# Patient Record
Sex: Female | Born: 1995 | Race: Black or African American | Hispanic: No | Marital: Single | Smoking: Never smoker
Health system: Southern US, Community
[De-identification: ages and names within clinical notes are randomized; demographics above are authoritative.]

## PROBLEM LIST (undated history)

## (undated) DIAGNOSIS — Z789 Other specified health status: Secondary | ICD-10-CM

## (undated) HISTORY — PX: APPENDECTOMY: SHX54

## (undated) HISTORY — PX: THERAPEUTIC ABORTION: SHX798

---

## 2017-12-28 ENCOUNTER — Encounter (HOSPITAL_COMMUNITY): Payer: Self-pay

## 2017-12-28 ENCOUNTER — Ambulatory Visit (HOSPITAL_COMMUNITY)
Admission: EM | Admit: 2017-12-28 | Discharge: 2017-12-28 | Disposition: A | Discharge status: Home / Self Care | Payer: Self-pay | Attending: Internal Medicine | Admitting: Internal Medicine

## 2017-12-28 ENCOUNTER — Encounter (HOSPITAL_COMMUNITY): Payer: Self-pay | Admitting: *Deleted

## 2017-12-28 ENCOUNTER — Emergency Department (HOSPITAL_COMMUNITY): Payer: Self-pay

## 2017-12-28 ENCOUNTER — Emergency Department (HOSPITAL_COMMUNITY)
Admission: EM | Admit: 2017-12-28 | Discharge: 2017-12-28 | Disposition: A | Discharge status: Home / Self Care | Payer: Self-pay | Attending: Emergency Medicine | Admitting: Emergency Medicine

## 2017-12-28 ENCOUNTER — Other Ambulatory Visit: Payer: Self-pay

## 2017-12-28 DIAGNOSIS — J0110 Acute frontal sinusitis, unspecified: Secondary | ICD-10-CM

## 2017-12-28 DIAGNOSIS — J0130 Acute sphenoidal sinusitis, unspecified: Secondary | ICD-10-CM | POA: Insufficient documentation

## 2017-12-28 DIAGNOSIS — J0131 Acute recurrent sphenoidal sinusitis: Secondary | ICD-10-CM

## 2017-12-28 LAB — CBC WITH DIFFERENTIAL/PLATELET
ABS IMMATURE GRANULOCYTES: 0 10*3/uL (ref 0.0–0.1)
BASOS PCT: 0 %
Basophils Absolute: 0 10*3/uL (ref 0.0–0.1)
Eosinophils Absolute: 0.1 10*3/uL (ref 0.0–0.7)
Eosinophils Relative: 1 %
HCT: 39.8 % (ref 36.0–46.0)
Hemoglobin: 13.2 g/dL (ref 12.0–15.0)
Immature Granulocytes: 1 %
Lymphocytes Relative: 17 %
Lymphs Abs: 1.3 10*3/uL (ref 0.7–4.0)
MCH: 32.5 pg (ref 26.0–34.0)
MCHC: 33.2 g/dL (ref 30.0–36.0)
MCV: 98 fL (ref 78.0–100.0)
MONO ABS: 0.4 10*3/uL (ref 0.1–1.0)
MONOS PCT: 5 %
NEUTROS ABS: 5.8 10*3/uL (ref 1.7–7.7)
Neutrophils Relative %: 76 %
PLATELETS: 233 10*3/uL (ref 150–400)
RBC: 4.06 MIL/uL (ref 3.87–5.11)
RDW: 11.6 % (ref 11.5–15.5)
WBC: 7.7 10*3/uL (ref 4.0–10.5)

## 2017-12-28 LAB — BASIC METABOLIC PANEL
Anion gap: 9 (ref 5–15)
BUN: 5 mg/dL — ABNORMAL LOW (ref 6–20)
CALCIUM: 10.1 mg/dL (ref 8.9–10.3)
CO2: 24 mmol/L (ref 22–32)
Chloride: 104 mmol/L (ref 98–111)
Creatinine, Ser: 0.72 mg/dL (ref 0.44–1.00)
GLUCOSE: 94 mg/dL (ref 70–99)
POTASSIUM: 3.9 mmol/L (ref 3.5–5.1)
Sodium: 137 mmol/L (ref 135–145)

## 2017-12-28 LAB — I-STAT BETA HCG BLOOD, ED (MC, WL, AP ONLY): I-stat hCG, quantitative: <5 m[IU]/mL (ref <5)

## 2017-12-28 MED ORDER — IBUPROFEN 800 MG PO TABS: 800.0000 mg | ORAL_TABLET | Freq: Three times a day (TID) | ORAL | 0 refills | Status: DC

## 2017-12-28 MED ORDER — KETOROLAC TROMETHAMINE 30 MG/ML IJ SOLN
15.0000 mg | Freq: Once | INTRAMUSCULAR | Status: AC
  Administered 2017-12-28: 15 mg via INTRAVENOUS
  Filled 2017-12-28: qty 1

## 2017-12-28 MED ORDER — METOCLOPRAMIDE HCL 5 MG/ML IJ SOLN
10.0000 mg | Freq: Once | INTRAMUSCULAR | Status: AC
  Administered 2017-12-28: 10 mg via INTRAVENOUS
  Filled 2017-12-28: qty 2

## 2017-12-28 MED ORDER — ACETAMINOPHEN 325 MG PO TABS
ORAL_TABLET | ORAL | Status: AC
  Filled 2017-12-28: qty 3

## 2017-12-28 MED ORDER — DOXYCYCLINE HYCLATE 100 MG PO CAPS: 100.0000 mg | ORAL_CAPSULE | Freq: Two times a day (BID) | ORAL | 0 refills | Status: AC

## 2017-12-28 MED ORDER — ACETAMINOPHEN 325 MG PO TABS
975.0000 mg | ORAL_TABLET | Freq: Once | ORAL | Status: AC
  Administered 2017-12-28: 975 mg via ORAL

## 2017-12-28 MED ORDER — SODIUM CHLORIDE 0.9 % IV BOLUS
1000.0000 mL | Freq: Once | INTRAVENOUS | Status: AC
  Administered 2017-12-28: 1000 mL via INTRAVENOUS

## 2017-12-28 MED ORDER — FLUTICASONE PROPIONATE 50 MCG/ACT NA SUSP: 1.0000 | Freq: Every day | NASAL | 2 refills | Status: DC

## 2017-12-28 MED ORDER — IOHEXOL 300 MG/ML  SOLN
75.0000 mL | Freq: Once | INTRAMUSCULAR | Status: AC | PRN
  Administered 2017-12-28: 75 mL via INTRAVENOUS

## 2017-12-28 MED ORDER — TRIAMCINOLONE ACETONIDE 40 MG/ML IJ SUSP
60.0000 mg | Freq: Once | INTRAMUSCULAR | Status: AC
  Administered 2017-12-28: 60 mg via INTRAMUSCULAR

## 2017-12-28 MED ORDER — TRIAMCINOLONE ACETONIDE 40 MG/ML IJ SUSP
INTRAMUSCULAR | Status: AC
  Filled 2017-12-28: qty 1

## 2017-12-28 NOTE — ED Notes (Signed)
Pt is tearful in triage. Per Hallie okay to give Tylenol in triage.

## 2017-12-28 NOTE — Discharge Instructions (Signed)
Headache Your lab results showed no acute abnormalities.  The CT, however, showed evidence of sinusitis.  Typically, sinusitis is caused by a virus, which do not respond to antibiotics, however, your case has some features that lead me to believe it may need antibiotics. Please take all of your antibiotics until finished!   You may develop abdominal discomfort or diarrhea from the antibiotic.  You may help offset this with probiotics which you can buy or get in yogurt. Do not eat or take the probiotics until 2 hours after your antibiotic.  Antiinflammatory medications: Take 600 mg of ibuprofen every 6 hours or 440 mg (over the counter dose) to 500 mg (prescription dose) of naproxen every 12 hours for the next 3 days. After this time, these medications may be used as needed for pain. Take these medications with food to avoid upset stomach. Choose only one of these medications, do not take them together. Tylenol: Should you continue to have additional pain while taking the ibuprofen or naproxen, you may add in tylenol as needed. Your daily total maximum amount of tylenol from all sources should be limited to 4000mg /day for persons without liver problems, or 2000mg /day for those with liver problems.  Hydration: Have a goal of about a half liter of water every couple hours to stay well hydrated.   Sleep: Please be sure to get plenty of sleep with a goal of 8 hours per night. Having a regular bed time and bedtime routine can help with this.  Screens: Reduce the amount of time you are in front of screens.  Take about a 5-10-minute break every hour or every couple hours to give your eyes rest.  Do not use screens in dark rooms.  Glasses with a blue light filter may also help reduce eye fatigue.  Stress: Take steps to reduce stress as much as possible.   Hand washing: Wash your hands throughout the day, but especially before and after touching the face, using the restroom, sneezing, coughing, or touching  surfaces that have been coughed or sneezed upon. Hydration: Symptoms will be intensified and complicated by dehydration. Dehydration can also extend the duration of symptoms. Drink plenty of fluids and get plenty of rest. You should be drinking at least half a liter of water an hour to stay hydrated. Electrolyte drinks (ex. Gatorade, Powerade, Pedialyte) are also encouraged. You should be drinking enough fluids to make your urine light yellow, almost clear. If this is not the case, you are not drinking enough water. Please note that some of the treatments indicated below will not be effective if you are not adequately hydrated. Pain or fever: Ibuprofen, Naproxen, or acetaminophen (generic for Tylenol) for pain or fever.  Zyrtec or Claritin: May add these medication daily to control underlying symptoms of congestion, sneezing, and other signs of allergies.  These medications are available over-the-counter. Generics: Cetirizine (generic for Zyrtec) and loratadine (generic for Claritin). Fluticasone: Use fluticasone (generic for Flonase), as directed, for nasal and sinus congestion.  This medication is available over-the-counter. Congestion: Saline sinus rinses and saline nasal sprays may help relieve congestion.  Follow up: Follow-up with a primary care provider on this issue.  May also need to follow-up with the ear nose and throat specialist due to repeat sinusitis.

## 2017-12-28 NOTE — ED Provider Notes (Addendum)
[facility]    CSN: [id number] Arrival date & time: 12/28/17  1229     History   Chief Complaint Chief Complaint  Patient presents with  . Facial Pain    HPI Tracie Howard is a 22 y.o. female.   [name] presents with her family with complaints of facial pressure and headache. Started yesterday and was intermittent but has become constant today. Pain 10/10. Causes neck pain and ear pain. No sore throat. No fevers. No gi/gu complaints. Feels pressure to nose but no running of mucus. No cough or sore throat. No known ill contacts. No rash. Denies any previous similar. Had not taken any medications prior to arrival. Without contributing medical history.    ROS per HPI.      History reviewed. No pertinent past medical history.  There are no active problems to display for this patient.   History reviewed. No pertinent surgical history.  OB History   None      Home Medications    Prior to Admission medications   Medication Sig Start Date End Date Taking? Authorizing Provider  fluticasone (FLONASE) 50 MCG/ACT nasal spray Place 1 spray into both nostrils daily. 12/28/17   Georgetta Haber, NP  ibuprofen (ADVIL,MOTRIN) 800 MG tablet Take 1 tablet (800 mg total) by mouth 3 (three) times daily. 12/28/17   Georgetta Haber, NP    Family History Family History  Problem Relation Age of Onset  . Hypertension Mother   . Diabetes Father     Social History Social History   Tobacco Use  . Smoking status: Never Smoker  . Smokeless tobacco: Never Used  Substance Use Topics  . Alcohol use: Not on file  . Drug use: Not on file     Allergies   Zosyn [piperacillin sod-tazobactam so]   Review of Systems Review of Systems   Physical Exam Triage Vital Signs ED Triage Vitals [12/28/17 1259]  Enc Vitals Group     BP 107/67     Pulse Rate 73     Resp 19     Temp 98 F (36.7 C)     Temp src      SpO2 100 %     Weight      Height      Head  Circumference      Peak Flow      Pain Score 10     Pain Loc      Pain Edu?      Excl. in GC?    No data found.  Updated Vital Signs BP 107/67   Pulse 73   Temp 98 F (36.7 C)   Resp 19   LMP 12/14/2017   SpO2 100%    Physical Exam  Constitutional: She is oriented to person, place, and time. She appears well-developed and well-nourished.  Patient appears very uncomfortable, laying out on table with limited cooperation with exam, moaning, not wanting even to speak  HENT:  Head: Normocephalic and atraumatic.  Right Ear: Tympanic membrane, external ear and ear canal normal.  Left Ear: Tympanic membrane, external ear and ear canal normal.  Nose: Mucosal edema present. Right sinus exhibits maxillary sinus tenderness and frontal sinus tenderness. Left sinus exhibits maxillary sinus tenderness and frontal sinus tenderness.  Mouth/Throat: Uvula is midline, oropharynx is clear and moist and mucous membranes are normal. No tonsillar exudate.  Slight mucosal edema present  Eyes: Pupils are equal, round, and reactive to light. Conjunctivae and EOM are normal.  Neck:  Full passive range of motion without pain. No spinous process tenderness present. No neck rigidity. No Brudzinski's sign and no Kernig's sign noted.  Generalized neck pain on palpation, no pain with passive ROM, no rigidity and negative meningeal signs  Cardiovascular: Normal rate, regular rhythm and normal heart sounds.  Pulmonary/Chest: Effort normal and breath sounds normal.  Neurological: She is alert and oriented to person, place, and time. Coordination normal.  Skin: Skin is warm and dry.     UC Treatments / Results  Labs (all labs ordered are listed, but only abnormal results are displayed) Labs Reviewed - No data to display  EKG None  Radiology No results found.  Procedures Procedures (including critical care time)  Medications Ordered in UC Medications  triamcinolone acetonide (KENALOG-40) injection 60  mg (has no administration in time range)  acetaminophen (TYLENOL) tablet 975 mg (975 mg Oral Given 12/28/17 1307)    Initial Impression / Assessment and Plan / UC Course  I have reviewed the triage vital signs and the nursing notes.  Pertinent labs & imaging results that were available during my care of the patient were reviewed by me and considered in my medical decision making (see chart for details).     Patient with quite significant pain, limited cooperation due to discomfort, pain reproducible to sinuses on palpation and this is what she primarily describes. Discussed trying IM triamcinolone to try to ease symptoms. On RN return to room to administer patient complains of visual changes now as well. Due to significance of pain, new for patient, no migraine hx, vision changes, recommend go to ER for further evaluation. Patient family agreeable to transporting patient.    On further chart review it does appear that patient had similar presentation in ER 9/27 with negative head CT.   Final Clinical Impressions(s) / UC Diagnoses   Final diagnoses:  Acute frontal sinusitis, recurrence not specified     Discharge Instructions     Exam is reassuring today.  I am hopeful that the injection we have given you will help with your symptoms.  May use ibuprofen for pain as well. Take with food.  Daily nasal spray. If worsening or no improvement of your symptoms please go to the ER for further evaluation.    ED Prescriptions    Medication Sig Dispense Auth. Provider   ibuprofen (ADVIL,MOTRIN) 800 MG tablet Take 1 tablet (800 mg total) by mouth 3 (three) times daily. 21 tablet Linus Mako B, NP   fluticasone (FLONASE) 50 MCG/ACT nasal spray Place 1 spray into both nostrils daily. 16 g Georgetta Haber, NP     Controlled Substance Prescriptions [place] Controlled Substance Registry consulted? Not Applicable   Georgetta Haber, NP 12/28/17 1433    Georgetta Haber, NP 12/28/17 1436

## 2017-12-28 NOTE — Discharge Instructions (Signed)
Exam is reassuring today.  I am hopeful that the injection we have given you will help with your symptoms.  May use ibuprofen for pain as well. Take with food.  Daily nasal spray. If worsening or no improvement of your symptoms please go to the ER for further evaluation.

## 2017-12-28 NOTE — ED Notes (Signed)
Pt in CT.

## 2017-12-28 NOTE — ED Notes (Signed)
Pa at   The bedside 

## 2017-12-28 NOTE — ED Triage Notes (Signed)
Pt states she had some sinus pain intermittent yesterday.  Then today at 11am started having consistent sinus pain that radiates up and around face.  She got a steroid shot today at [facility] and her vision changes are not getting better and she reports blurriness.  Sent to ED for no improvement.  APP at bedside and assessing patient

## 2017-12-28 NOTE — ED Notes (Signed)
While going over discharge instructions with the patient. Patient states she is starting to have visual changes in both eyes and the pain is getting worse. Patient referred to the ER for further evaluation. Verbalized understanding by patient and friend.

## 2017-12-28 NOTE — ED Notes (Signed)
Pt returned from ct

## 2017-12-28 NOTE — ED Provider Notes (Signed)
[facility] EMERGENCY DEPARTMENT Provider Note   CSN: [id number] Arrival date & time: 12/28/17  1435     History   Chief Complaint Chief Complaint  Patient presents with  . Eye Problem  . Facial Pain    HPI Tracie Howard is a 22 y.o. female.  HPI   Tracie Howard is a 22 y.o. female, patient with no pertinent past medical history, presenting to the ED with headache beginning yesterday. Pain is frontal, bilateral, originally 9/10, pressure, radiating throughout the head. Received decadron and tylenol at UC prior to coming to ED. Pain has improved to 3/10.  Accompanied by some nasal congestion and bilateral blurry vision. Different from previous headaches because this headache did not resolve with tylenol.  Denies syncope, neuro deficits, vision loss, fever/chills, neck stiffness, vomiting, chest pain, shortness of breath, rash, head injury, or any other complaints.    History reviewed. No pertinent past medical history.  There are no active problems to display for this patient.   Past Surgical History:  Procedure Laterality Date  . APPENDECTOMY       OB History   None      Home Medications    Prior to Admission medications   Medication Sig Start Date End Date Taking? Authorizing Provider  doxycycline (VIBRAMYCIN) 100 MG capsule Take 1 capsule (100 mg total) by mouth 2 (two) times daily for 7 days. 12/28/17 01/04/18  Auburn Hester C, PA-C  fluticasone (FLONASE) 50 MCG/ACT nasal spray Place 1 spray into both nostrils daily. 12/28/17   Zigmund Gottron, NP  ibuprofen (ADVIL,MOTRIN) 800 MG tablet Take 1 tablet (800 mg total) by mouth 3 (three) times daily. 12/28/17   Zigmund Gottron, NP    Family History Family History  Problem Relation Age of Onset  . Hypertension Mother   . Diabetes Father     Social History Social History   Tobacco Use  . Smoking status: Never Smoker  . Smokeless tobacco: Never Used  Substance Use Topics  . Alcohol  use: Never    Frequency: Never  . Drug use: Never     Allergies   Zosyn [piperacillin sod-tazobactam so]   Review of Systems Review of Systems  Constitutional: Negative for chills and fever.  HENT: Positive for congestion. Negative for sore throat and trouble swallowing.   Eyes: Positive for visual disturbance.  Respiratory: Negative for shortness of breath.   Cardiovascular: Negative for chest pain.  Gastrointestinal: Positive for nausea. Negative for abdominal pain, diarrhea and vomiting.  Musculoskeletal: Negative for back pain, neck pain and neck stiffness.  Skin: Negative for rash.  Neurological: Positive for light-headedness and headaches. Negative for seizures, syncope, facial asymmetry, weakness and numbness.     Physical Exam Updated Vital Signs BP 113/71 (BP Location: Right Arm)   Pulse (!) 137   Temp 98.2 F (36.8 C) (Oral)   Resp 16   LMP 12/14/2017   SpO2 100%   Physical Exam  Constitutional: She is oriented to person, place, and time. She appears well-developed and well-nourished. No distress.  HENT:  Head: Normocephalic and atraumatic.  Nose: Mucosal edema present. No nasal septal hematoma. Right sinus exhibits maxillary sinus tenderness and frontal sinus tenderness. Left sinus exhibits maxillary sinus tenderness and frontal sinus tenderness.  Eyes: Pupils are equal, round, and reactive to light. Conjunctivae and EOM are normal.  Though patient stated her vision is blurry, she readily performed finger-to-nose testing without noted difficulty or hesitation. Patient states she is supposed to wear  glasses, but does not wear them.    Visual Acuity  Right Eye Distance: 10/50 Left Eye Distance: 10/63 Bilateral Distance: 10/63  Right Eye Near:   Left Eye Near:    Bilateral Near:     Neck: Normal range of motion. Neck supple. Muscular tenderness present. No neck rigidity.  Cardiovascular: Normal rate, regular rhythm, normal heart sounds and intact distal  pulses.  Pulmonary/Chest: Effort normal and breath sounds normal. No respiratory distress.  Abdominal: Soft. There is no tenderness. There is no guarding.  Musculoskeletal: She exhibits no edema.  Lymphadenopathy:    She has no cervical adenopathy.  Neurological: She is alert and oriented to person, place, and time.  Sensation grossly intact to light touch in the extremities. Strength 5/5 in all extremities. No gait disturbance. Coordination intact. Cranial nerves III-XII grossly intact. No facial droop.   Skin: Skin is warm and dry. She is not diaphoretic.  Psychiatric: She has a normal mood and affect. Her behavior is normal.  Nursing note and vitals reviewed.    ED Treatments / Results  Labs (all labs ordered are listed, but only abnormal results are displayed) Labs Reviewed  BASIC METABOLIC PANEL - Abnormal; Notable for the following components:      Result Value   BUN 5 (*)    All other components within normal limits  CBC WITH DIFFERENTIAL/PLATELET  I-STAT BETA HCG BLOOD, ED ([facility], [facility], [facility] ONLY)    EKG None  Radiology Ct Orbits W Contrast  Result Date: 12/28/2017 CLINICAL DATA:  Bilateral visual changes. Pain with eye movement. Concern for orbital cellulitis. EXAM: CT ORBITS WITH CONTRAST TECHNIQUE: Multidetector CT images was performed according to the standard protocol following intravenous contrast administration. CONTRAST:  25m OMNIPAQUE IOHEXOL 300 MG/ML  SOLN COMPARISON:  None. FINDINGS: Orbits: The globes appear intact. No significant inflammatory changes are seen in the orbital fat. There is no mass. The optic nerve complexes are symmetric and normal in appearance. The extraocular muscles and lacrimal glands are unremarkable. There is no evidence of significant preseptal soft tissue inflammation. Visualized sinuses: Mild-to-moderate right and mild left maxillary sinus mucosal thickening. Small left maxillary sinus mucous retention cyst. Trace right maxillary sinus fluid.  Bilateral sphenoid sinus fluid, large volume on the left. Clear mastoid air cells. Soft tissues: Unremarkable. Limited intracranial: Unremarkable. IMPRESSION: 1. Unremarkable appearance of the orbits. 2. Bilateral sinusitis including large volume fluid in the left sphenoid sinus. Electronically Signed   By: ALogan BoresM.D.   On: 12/28/2017 17:03    Procedures Procedures (including critical care time)  Medications Ordered in ED Medications  iohexol (OMNIPAQUE) 300 MG/ML solution 75 mL (75 mLs Intravenous Contrast Given 12/28/17 1629)  sodium chloride 0.9 % bolus 1,000 mL (0 mLs Intravenous Stopped 12/28/17 1916)  metoCLOPramide (REGLAN) injection 10 mg (10 mg Intravenous Given 12/28/17 1748)  ketorolac (TORADOL) 30 MG/ML injection 15 mg (15 mg Intravenous Given 12/28/17 1748)     Initial Impression / Assessment and Plan / ED Course  I have reviewed the triage vital signs and the nursing notes.  Pertinent labs & imaging results that were available during my care of the patient were reviewed by me and considered in my medical decision making (see chart for details).  Clinical Course as of Dec 29 1935  SNancy FetterOct 06, 2019  1635 Patient not yet in the room.  Already taken to CT.   [SJ]  1914 Patient states her symptoms have resolved and have not recurred.   [SJ]  Clinical Course User Index [SJ] Lorayne Bender, PA-C    Patient presents with headache. Patient is nontoxic appearing, afebrile, not tachycardic on my exam, not tachypneic, not hypotensive, maintains excellent SPO2 on room air, and is in no apparent distress.  No focal neuro deficits.  CT orbits ordered by PA in triage.  Evidence of sinusitis.  Due to the amount of pain patient reports and or additional symptoms, we will initiate a course of antibiotics.  PCP and ENT follow-up. The patient was given instructions for home care as well as return precautions. Patient voices understanding of these instructions, accepts the plan, and is  comfortable with discharge.   Vitals:   12/28/17 1743 12/28/17 1745 12/28/17 1800 12/28/17 1830  BP: 114/76 115/64 114/84 122/83  Pulse: 74 64 89   Resp: 16     Temp:      TempSrc:      SpO2: 100% 100% 100%      Patient was seen in two separate EDs in [place] area, August 20 and September 27 for "not feeling well, headache" and "body feeling heavy, headache," respectively, in addition to a myriad of other symptoms.  Labs were largely unremarkable, including a normal TSH both visits.  CT of the head September 27 was unremarkable other than mild mucosal thickening in the right maxillary sinus. Head CT August 20 showed paranasal sinus inflammation.  She was treated with 10 days of Bactrim. She has been told multiple times to follow-up with a primary care provider, but has not done so.     Final Clinical Impressions(s) / ED Diagnoses   Final diagnoses:  Acute recurrent sphenoidal sinusitis    ED Discharge Orders         Ordered    doxycycline (VIBRAMYCIN) 100 MG capsule  2 times daily     12/28/17 1736           Lorayne Bender, PA-C 12/28/17 1938    Valarie Merino, MD 12/28/17 2206

## 2017-12-28 NOTE — ED Provider Notes (Addendum)
MSE was initiated and I personally evaluated the patient and placed orders (if any) at  3:13 PM on December 28, 2017.  The patient appears stable so that the remainder of the MSE may be completed by another provider.  22 year old female with no pertinent past medical history presenting to the ED from urgent care with retro-orbital headache, sinus pain and pressure, and nasal congestion that began intermittently yesterday, but became constant around 11 AM.  She states that the pain radiates around her bilateral eyes and bilateral forehead.  She reports pressure to the nose, but no rhinorrhea or sneezing.  No history of similar.  She was evaluated at urgent care where she was given Tylenol and a shot of Decadron.  While she was at urgent care she began to have significant pain with moving her eyes from side to side and her vision became blurred bilaterally.  No diplopia.  She also reports that when she moves moves her neck towards her chest to look down and then moves her head back up and looks up that her bilateral vision will go "black" for approximately 1 to 2 seconds before becoming blurred again.   On exam, pupils are equal round and reactive.  No nystagmus.  She self reports increased pain with lateral and medial movement of the bilateral eyes.  Extraocular movements are intact.  There is some injection of the conjunctivae, but sclera are clear.  No tearing or purulent drainage noting.  She is tender to palpation to the bilateral frontal and maxillary sinuses.  She sounds congested.  Middle ear effusion bilaterally.  No pain with movement of the auricle or tragus.  No mastoid tenderness bilaterally.  She did have a CT head on 12/21/17 at [facility] emergency department after she presented with complaints of generalized weakness, headache, and generalized malaise.  CT head was unremarkable, but did show some mild right maxillary sinusitis.  Is also seen at [facility] ED on 11/11/2017 appears that she was  discharged with a prescription for Bactrim twice daily for 10 days.  The patient states that she cannot recall whether or not that she took the medication.   Although I suspect that she is presenting with pansinusitis, given concern for visual changes will order CT orbits to ensure the patient has not developed a septal or preseptal cellulitis given that she has pain with movement of the eyes.  ADDENDUM: Was asked by the patient's father to come back into the triage room.  The patient is concerned that the visit from 11/11/2017 may have been her sister.  Reviewed demographic information, which match the patient that is here today.  She later recalls that she was seen for palpitations and a sinus infection and she did complete the 10-day course of antibiotics following that visit.   Barkley Boards, PA-C 12/28/17 1513    Barkley Boards, PA-C 12/28/17 1519    Wynetta Fines, MD 12/28/17 2206

## 2017-12-28 NOTE — ED Notes (Signed)
Iv fluid almost  infused 

## 2017-12-28 NOTE — ED Triage Notes (Signed)
Pt presents with frontal headache and sinus pressure radiating to the back of her head x 2 days. Yesterday it was intermittent. Today it is constant.

## 2018-02-17 ENCOUNTER — Encounter (HOSPITAL_COMMUNITY): Payer: Self-pay

## 2018-02-17 ENCOUNTER — Inpatient Hospital Stay (HOSPITAL_COMMUNITY)
Admission: AD | Admit: 2018-02-17 | Discharge: 2018-02-17 | Disposition: A | Discharge status: Home / Self Care | Payer: Self-pay | Source: Ambulatory Visit | Attending: Obstetrics and Gynecology | Admitting: Obstetrics and Gynecology

## 2018-02-17 DIAGNOSIS — Z3202 Encounter for pregnancy test, result negative: Secondary | ICD-10-CM | POA: Insufficient documentation

## 2018-02-17 DIAGNOSIS — Z7951 Long term (current) use of inhaled steroids: Secondary | ICD-10-CM | POA: Insufficient documentation

## 2018-02-17 DIAGNOSIS — N939 Abnormal uterine and vaginal bleeding, unspecified: Secondary | ICD-10-CM

## 2018-02-17 HISTORY — DX: Other specified health status: Z78.9

## 2018-02-17 LAB — URINALYSIS, ROUTINE W REFLEX MICROSCOPIC
BACTERIA UA: NONE SEEN
Bilirubin Urine: NEGATIVE
Glucose, UA: NEGATIVE mg/dL
KETONES UR: NEGATIVE mg/dL
Leukocytes, UA: NEGATIVE
Nitrite: NEGATIVE
Protein, ur: NEGATIVE mg/dL
Specific Gravity, Urine: 1.008 (ref 1.005–1.030)
pH: 9 — ABNORMAL HIGH (ref 5.0–8.0)

## 2018-02-17 LAB — WET PREP, GENITAL
CLUE CELLS WET PREP: NONE SEEN
SPERM: NONE SEEN
TRICH WET PREP: NONE SEEN
YEAST WET PREP: NONE SEEN

## 2018-02-17 LAB — HEMOGLOBIN AND HEMATOCRIT, BLOOD
HCT: 35.3 % — ABNORMAL LOW (ref 36.0–46.0)
Hemoglobin: 12.1 g/dL (ref 12.0–15.0)

## 2018-02-17 LAB — POCT PREGNANCY, URINE: Preg Test, Ur: NEGATIVE

## 2018-02-17 MED ORDER — MEGESTROL ACETATE 40 MG PO TABS: 40.0000 mg | ORAL_TABLET | Freq: Two times a day (BID) | ORAL | 0 refills | Status: DC | PRN

## 2018-02-17 NOTE — MAU Note (Addendum)
Pt complains of vaginal bleeding and feeling weak that has been on and off since 10/26. States she is having change a regular sized pad every 2 hours with small clots. Pt reports intermittent HA and stomach pain. States she took Tylenol about 2 hours ago. Reports she had a fever of 102. Only reports a cough. Pt states she is not pregnant as she has not had sex in 2 years. Reports that she is not on any contraceptive.

## 2018-02-17 NOTE — Discharge Instructions (Signed)
Insufficient Money for Medicine:           United Way: call "211"    MAP Program at [facility] [place] [phone] or [phone]            No Primary Care Doctor:  To locate a primary care doctor that accepts your insurance or provides certain services:           [place] Connect: [phone]           Physician Referral Service: [phone] ask for My [place]  If no insurance, you need to see if you qualify for [facility] orange card, call to set      up appointment for eligibility/enrollment at [phone] or [phone] or visit [facility] and [employer] ([address], [place] and [address]) to meet with a [facility] enrollment specialist.  Agencies that provide inexpensive (sliding fee scale) medical care:       [facility] - Family Medicine at [place] - [phone]     [facility]  -  [facility] [phone]     [facility] - [phone]     [facility] - [phone]     [facility] [phone]- [phone]     [facility] [phone]- [phone]  [facility] - [facility] @ [place] [phone]     [phone]  [facility] - [facility] @ [place] - [phone]  [facility]: [phone]   Women's Clinic: [phone]   Planned Parenthood: [phone]   [facility]- [phone]    Medicaid-accepting [facility] Providers:           [facility] [phone]- [phone] (No Family Planning accepted)          [address], [phone], Mon-Fri 9am-5pm          [facility] - [phone]  [address], [address] [place], Mon-Thursday 8am-5pm, Fri 8am-noon  [facility] - [phone]          [address], [address], Mon-Fri  7:30am-4:30pm          [facility] - [phone]          [address], [place]-Fri 8am-5pm          [facility] - [phone]          [address], [address]          Only accepts [place] [employer] Medicaid patients after they have their name applied to their card  Self Pay (no insurance) in [facility]:           Sickle Cell Patients:   [address], [phone] [facility]:  [address], [place] [phone]       [facility]  [address] [place], [place] [phone]  [facility]:  [address], [phone]          [facility]           [address], [phone] [facility]  [address], [phone]  [facility] [place]           [address], [address], [place] [phone]        [facility] - [address]           [phone], Mon-Fri 9am-7pm, [place] 9am-1pm          [facility] - Family Medicine @ [facility]          [address] [place], [phone]          [facility] - [facility]           [address], [phone] [facility] - [facility]  [address], [place] [phone]          [facility]           [address], [phone]  [facility] - [facility]   [address] [place], [place] [phone] [facility] - [facility]  [address] [phone]  Dr. Julio Sicks           [address], [place], [phone]          [facility]           [address] [phone]          [facility]             [address], [phone]          [facility]           [address]  [place], [phone], 1st & 3rd Saturday every month, 10am-1pm OTHERS:  [employer]  (Immigration [employer] Only)  [phone] (Thursday only) Strategies for finding a Primary Care Provider:  1) Find a Doctor and Pay Out of Pocket  Although you won't have to find out who is covered by your insurance plan, it is a good idea to ask around and get recommendations. You will then need to call the office and see if the doctor you have chosen will accept you as a new patient and what types of options they offer for patients who are self-pay. Some doctors offer discounts or will set up payment plans for their patients who do not have insurance, but you will need to ask so you aren't surprised when you get to your appointment.  2) Contact [place] [employer] - To see if you qualify for orange card access to healthcare safety net providers.  Call for appointment for eligibility/enrollment at [phone] or [phone]. (Uninsured, 0-200% FPL, qualifying info)  Applicants for [facility] are first required to see if they are eligible to enroll in the [facility] Marketplace before enrolling in [facility] (and get an exemption if they are not).  GCCN Criteria for acceptance is:  ? Proof of [employer] Marketing exemption - form or documentation  ? Valid photo ID (driver's license, state identification card, passport, home country ID)  ? Proof of [facility] residency (e.g. drivers license, lease/landlord information, pay stubs with address, utility bill, bank statement, etc.)  ? Proof of income (1040, last year's tax return, W2, 4 current pay stubs, other income proof)  ? Proof of assets (current bank statement + 3 most recent, disability paperwork, life insurance info, tax value on autos,  etc.)  3) Contact Your Local Health Department  Not all health departments have doctors that can see patients for sick visits, but many do, so it is worth a call to see if yours does. If you don't know where your local health  department is, you can check in your phone book. The CDC also has a tool to help you locate your state's health department, and many state websites also have listings of all of their local health departments.  4) Find a Walk-in Clinic  If your illness is not likely to be very severe or complicated, you may want to try a walk in clinic. These are popping up all over the country in pharmacies, drugstores, and shopping centers. They're usually staffed by nurse practitioners or physician assistants that have been trained to treat common illnesses and complaints. They're usually fairly quick and inexpensive. However, if you have serious medical issues or chronic medical problems, these are probably not your best option                      Dysfunctional Uterine Bleeding Dysfunctional uterine bleeding is abnormal bleeding from the uterus. Dysfunctional uterine bleeding includes:  A period that comes earlier or later than usual.  A period that is lighter, heavier, or has blood clots.  Bleeding between periods.  Skipping one or more periods.  Bleeding after sexual intercourse.  Bleeding after menopause.  Follow these instructions at home: Pay attention to any changes in your symptoms. Follow these instructions to help with your condition: Eating and drinking  Eat well-balanced meals. Include foods that are high in iron, such as liver, meat, shellfish, green leafy vegetables, and eggs.  If you become constipated: ? Drink plenty of water. ? Eat fruits and vegetables that are high in water and fiber, such as spinach, carrots, raspberries, apples, and mango. Medicines  Take over-the-counter and prescription medicines only as told by your health care provider.  Do not change medicines without talking with your health care provider.  Aspirin or medicines that contain aspirin may make the bleeding worse. Do not take those medicines: ? During the week before your  period. ? During your period.  If you were prescribed iron pills, take them as told by your health care provider. Iron pills help to replace iron that your body loses because of this condition. Activity  If you need to change your sanitary pad or tampon more than one time every 2 hours: ? Lie in bed with your feet raised (elevated). ? Place a cold pack on your lower abdomen. ? Rest as much as possible until the bleeding stops or slows down.  Do not try to lose weight until the bleeding has stopped and your blood iron level is back to normal. Other Instructions  For two months, write down: ? When your period starts. ? When your period ends. ? When any abnormal bleeding occurs. ? What problems you notice.  Keep all follow up visits as told by your health care provider. This is important. Contact a health care provider if:  You get light-headed or weak.  You have nausea and vomiting.  You cannot eat or drink without vomiting.  You feel dizzy or have diarrhea while you are taking medicines.  You are taking birth control pills or hormones, and you want to change them or stop taking them. Get help right away if:  You develop a fever or chills.  You need to change your sanitary pad or  tampon more than one time per hour.  Your bleeding becomes heavier, or your flow contains clots more often.  You develop pain in your abdomen.  You lose consciousness.  You develop a rash. This information is not intended to replace advice given to you by your health care provider. Make sure you discuss any questions you have with your health care provider. Document Released: 03/08/2000 Document Revised: 08/17/2015 Document Reviewed: 06/06/2014 Elsevier Interactive Patient Education  [employer].

## 2018-02-17 NOTE — MAU Provider Note (Signed)
Chief Complaint: Vaginal Bleeding   First Provider Initiated Contact with Patient 02/17/18 0111     SUBJECTIVE HPI: Tracie Howard is a 22 y.o. non pregnant female who presents to Maternity Admissions reporting irregular vaginal bleeding. Reports hx of irregular periods after the birth of her daughter 5 years ago. Was started on OCPs to regulate her cycle but discontinued them over a year ago b/c she didn't like the way they made her feel. For the last year has had regular monthly cycles until last month. Reports daily bleeding since 10/25. Bleeding has been consistently heavy. Has to change her pad every 2 hours & reports that it is full when she changes it. Also reports cramping with bleeding. [place] & has not established a PCP yet.  Has not been sexually active in over a year.   Location: lower abdomen Quality: cramping Severity: 5/10 on pain scale Duration: 1 month Timing: intermittent Modifying factors: none Associated signs and symptoms: vaginal bleeding  Past Medical History:  Diagnosis Date  . Medical history non-contributory    OB History  Gravida Para Term Preterm AB Living  2 1 1   1 1   SAB TAB Ectopic Multiple Live Births    1          # Outcome Date GA Lbr Len/2nd Weight Sex Delivery Anes PTL Lv  2 TAB 09/2016 [id number]         1 Term 02/17/13           Past Surgical History:  Procedure Laterality Date  . APPENDECTOMY     Social History   Socioeconomic History  . Marital status: Single    Spouse name: Not on file  . Number of children: Not on file  . Years of education: Not on file  . Highest education level: Not on file  Occupational History  . Not on file  Social Needs  . Financial resource strain: Not on file  . Food insecurity:    Worry: Not on file    Inability: Not on file  . Transportation needs:    Medical: Not on file    Non-medical: Not on file  Tobacco Use  . Smoking status: Never Smoker  . Smokeless tobacco: Never  Used  Substance and Sexual Activity  . Alcohol use: Never    Frequency: Never  . Drug use: Never  . Sexual activity: Not on file  Lifestyle  . Physical activity:    Days per week: Not on file    Minutes per session: Not on file  . Stress: Not on file  Relationships  . Social connections:    Talks on phone: Not on file    Gets together: Not on file    Attends religious service: Not on file    Active member of club or organization: Not on file    Attends meetings of clubs or organizations: Not on file    Relationship status: Not on file  . Intimate partner violence:    Fear of current or ex partner: Not on file    Emotionally abused: Not on file    Physically abused: Not on file    Forced sexual activity: Not on file  Other Topics Concern  . Not on file  Social History Narrative  . Not on file   Family History  Problem Relation Age of Onset  . Hypertension Mother   . Diabetes Father    No current facility-administered medications on file prior to encounter.  Current Outpatient Medications on File Prior to Encounter  Medication Sig Dispense Refill  . acetaminophen (TYLENOL) 650 MG CR tablet Take 650 mg by mouth every 8 (eight) hours as needed for pain.    . fluticasone (FLONASE) 50 MCG/ACT nasal spray Place 1 spray into both nostrils daily. 16 g 2  . ibuprofen (ADVIL,MOTRIN) 800 MG tablet Take 1 tablet (800 mg total) by mouth 3 (three) times daily. 21 tablet 0   Allergies  Allergen Reactions  . Zosyn [Piperacillin Sod-Tazobactam So] Rash    I have reviewed patient's Past Medical Hx, Surgical Hx, Family Hx, Social Hx, medications and allergies.   Review of Systems  Constitutional: Negative.   Respiratory: Positive for cough.   Gastrointestinal: Positive for abdominal pain. Negative for constipation, diarrhea, nausea and vomiting.  Genitourinary: Positive for menstrual problem and vaginal bleeding.    OBJECTIVE Patient Vitals for the past 24 hrs:  BP Temp Temp  src Pulse Resp SpO2 Height Weight  02/17/18 0245 101/62 - - - - - - -  02/17/18 0052 115/66 100.1 F (37.8 C) Oral (!) 104 18 99 % - -  02/17/18 0042 - - - - - - 5\' 7"  (1.702 m) 62.3 kg   Constitutional: Well-developed, well-nourished female in no acute distress.  Cardiovascular: normal rate & rhythm, no murmur Respiratory: normal rate and effort. Lung sounds clear throughout GI: Abd soft, non-tender, Pos BS x 4. No guarding or rebound tenderness MS: Extremities nontender, no edema, normal ROM Neurologic: Alert and oriented x 4.  GU:     SPECULUM EXAM: NEFG, physiologic discharge, small amount of dark red mucoid blood. Cervix pink/smooth without lesions or friability  BIMANUAL: No CMT. uterus normal size, no adnexal tenderness or masses.    LAB RESULTS Results for orders placed or performed during the hospital encounter of 02/17/18 (from the past 24 hour(s))  Urinalysis, Routine w reflex microscopic     Status: Abnormal   Collection Time: 02/17/18 12:44 AM  Result Value Ref Range   Color, Urine YELLOW YELLOW   APPearance CLEAR CLEAR   Specific Gravity, Urine 1.008 1.005 - 1.030   pH 9.0 (H) 5.0 - 8.0   Glucose, UA NEGATIVE NEGATIVE mg/dL   Hgb urine dipstick LARGE (A) NEGATIVE   Bilirubin Urine NEGATIVE NEGATIVE   Ketones, ur NEGATIVE NEGATIVE mg/dL   Protein, ur NEGATIVE NEGATIVE mg/dL   Nitrite NEGATIVE NEGATIVE   Leukocytes, UA NEGATIVE NEGATIVE   RBC / HPF 0-5 0 - 5 RBC/hpf   WBC, UA 0-5 0 - 5 WBC/hpf   Bacteria, UA NONE SEEN NONE SEEN   Squamous Epithelial / LPF 0-5 0 - 5  Pregnancy, urine POC     Status: None   Collection Time: 02/17/18 12:46 AM  Result Value Ref Range   Preg Test, Ur NEGATIVE NEGATIVE  Hemoglobin and hematocrit, blood     Status: Abnormal   Collection Time: 02/17/18  1:30 AM  Result Value Ref Range   Hemoglobin 12.1 12.0 - 15.0 g/dL   HCT 16.1 (L) 09.6 - 04.5 %  Wet prep, genital     Status: Abnormal   Collection Time: 02/17/18  1:48 AM  Result  Value Ref Range   Yeast Wet Prep HPF POC NONE SEEN NONE SEEN   Trich, Wet Prep NONE SEEN NONE SEEN   Clue Cells Wet Prep HPF POC NONE SEEN NONE SEEN   WBC, Wet Prep HPF POC MODERATE (A) NONE SEEN   Sperm NONE SEEN  IMAGING No results found.  MAU COURSE Orders Placed This Encounter  Procedures  . Wet prep, genital  . Urinalysis, Routine w reflex microscopic  . Hemoglobin and hematocrit, blood  . Pregnancy, urine POC  . Discharge patient   Meds ordered this encounter  Medications  . megestrol (MEGACE) 40 MG tablet    Sig: Take 1 tablet (40 mg total) by mouth 2 (two) times daily as needed.    Dispense:  60 tablet    Refill:  0    Order Specific Question:   Supervising Provider    Answer:   ERVIN, MICHAEL L [[id number]]    MDM UPT negative Pt with low grade fever. Does not have appendix. Abdomen soft & non tender. Has s/s of URI (cough & congestion; no sore throat or body aches).  Hgb stable at 12.1 & minimal blood seen on exam.  Pt stable for discharge. Discussed plan for megace & outpatient evaluation for continuing symptoms. Will also give info for patient to establish PCP in the area  ASSESSMENT 1. Abnormal uterine bleeding (AUB)   2. Pregnancy examination or test, negative result     PLAN Discharge home in stable condition. GC/CT pending  Follow-up Information    [facility] Follow up.   Specialty:  Obstetrics and Gynecology Contact information: [address] [place] [address] [phone]         Allergies as of 02/17/2018      Reactions   Zosyn [piperacillin Sod-tazobactam So] Rash      Medication List    TAKE these medications   acetaminophen 650 MG CR tablet Commonly known as:  TYLENOL Take 650 mg by mouth every 8 (eight) hours as needed for pain.   fluticasone 50 MCG/ACT nasal spray Commonly known as:  FLONASE Place 1 spray into both nostrils daily.   ibuprofen 800 MG tablet Commonly known as:   ADVIL,MOTRIN Take 1 tablet (800 mg total) by mouth 3 (three) times daily.   megestrol 40 MG tablet Commonly known as:  MEGACE Take 1 tablet (40 mg total) by mouth 2 (two) times daily as needed.        Judeth Horn, NP 02/17/2018  4:33 AM

## 2018-02-18 LAB — GC/CHLAMYDIA PROBE AMP (~~LOC~~) NOT AT ARMC
Chlamydia: NEGATIVE
Neisseria Gonorrhea: NEGATIVE

## 2018-03-03 ENCOUNTER — Emergency Department (HOSPITAL_COMMUNITY): Payer: Self-pay

## 2018-03-03 ENCOUNTER — Encounter (HOSPITAL_COMMUNITY): Payer: Self-pay | Admitting: Emergency Medicine

## 2018-03-03 ENCOUNTER — Emergency Department (HOSPITAL_COMMUNITY)
Admission: EM | Admit: 2018-03-03 | Discharge: 2018-03-03 | Disposition: A | Discharge status: Home / Self Care | Payer: Self-pay | Attending: Emergency Medicine | Admitting: Emergency Medicine

## 2018-03-03 DIAGNOSIS — T3695XA Adverse effect of unspecified systemic antibiotic, initial encounter: Secondary | ICD-10-CM | POA: Insufficient documentation

## 2018-03-03 DIAGNOSIS — J189 Pneumonia, unspecified organism: Secondary | ICD-10-CM | POA: Insufficient documentation

## 2018-03-03 DIAGNOSIS — T50905A Adverse effect of unspecified drugs, medicaments and biological substances, initial encounter: Secondary | ICD-10-CM

## 2018-03-03 MED ORDER — AZITHROMYCIN 250 MG PO TABS: ORAL_TABLET | ORAL | 0 refills | Status: DC

## 2018-03-03 NOTE — ED Provider Notes (Signed)
[facility]-EMERGENCY DEPT Provider Note   CSN: [id number] Arrival date & time: 03/03/18  1418     History   Chief Complaint Chief Complaint  Patient presents with  . Rash  . Allergic Reaction    HPI Tracie Howard is a 22 y.o. female who presents to the ED for allergic reaction. Patient reports she was evaluated a [place] ago in another city and had cxr that showed pneumonia. She was started on a quinolone and every time she takes a pill she breaks out in a rash and throat gets scratchy. She reports she did not take her last 2 pills because of the reaction. Patient denies fever, shortness of breath, difficulty swallowing or rash at this time. Last took antibiotic 2 days ago.    HPI  Past Medical History:  Diagnosis Date  . Medical history non-contributory     There are no active problems to display for this patient.   Past Surgical History:  Procedure Laterality Date  . APPENDECTOMY       OB History    Gravida  2   Para  1   Term  1   Preterm      AB  1   Living  1     SAB      TAB  1   Ectopic      Multiple      Live Births               Home Medications    Prior to Admission medications   Medication Sig Start Date End Date Taking? Authorizing Provider  acetaminophen (TYLENOL) 650 MG CR tablet Take 650 mg by mouth every 8 (eight) hours as needed for pain.    [provider]  azithromycin (ZITHROMAX Z-PAK) 250 MG tablet Take the first 2 tablets today and then one tablet PO daily. 03/03/18   Janne NapoleonNeese, Matan Steen M, NP  fluticasone (FLONASE) 50 MCG/ACT nasal spray Place 1 spray into both nostrils daily. 12/28/17   Georgetta HaberBurky, Natalie B, NP  ibuprofen (ADVIL,MOTRIN) 800 MG tablet Take 1 tablet (800 mg total) by mouth 3 (three) times daily. 12/28/17   Georgetta HaberBurky, Natalie B, NP  megestrol (MEGACE) 40 MG tablet Take 1 tablet (40 mg total) by mouth 2 (two) times daily as needed. 02/17/18   Judeth HornLawrence, Erin, NP    Family History Family  History  Problem Relation Age of Onset  . Hypertension Mother   . Diabetes Father     Social History Social History   Tobacco Use  . Smoking status: Never Smoker  . Smokeless tobacco: Never Used  Substance Use Topics  . Alcohol use: Never    Frequency: Never  . Drug use: Never     Allergies   Zosyn [piperacillin sod-tazobactam so]   Review of Systems Review of Systems  HENT: Positive for congestion.   Respiratory: Positive for cough.   Skin: Positive for rash.  All other systems reviewed and are negative.    Physical Exam Updated Vital Signs BP 111/66 (BP Location: Left Arm)   Pulse 73   Temp 99.1 F (37.3 C)   Resp 18   Ht 5\' 7"  (1.702 m)   Wt 64 kg   LMP 02/15/2018 (Approximate)   SpO2 100%   BMI 22.10 kg/m   Physical Exam  Constitutional: She appears well-developed and well-nourished. No distress.  HENT:  Head: Normocephalic and atraumatic.  Eyes: Conjunctivae and EOM are normal.  Neck: Neck supple.  Cardiovascular: Normal  rate and regular rhythm.  Pulmonary/Chest: Effort normal. No respiratory distress. She has no wheezes. She has no rales.  Musculoskeletal: Normal range of motion.  Neurological: She is alert.  Skin: Skin is warm and dry.  Psychiatric: She has a normal mood and affect. Her behavior is normal.  Nursing note and vitals reviewed.    ED Treatments / Results  Labs (all labs ordered are listed, but only abnormal results are displayed) Labs Reviewed - No data to display  Radiology Dg Chest 2 View  Result Date: 03/03/2018 CLINICAL DATA:  Developed a rash after taking medication. History of prior follow-up EXAM: CHEST - 2 VIEW COMPARISON:  None. FINDINGS: There is hazy opacity in the left mid lung most consistent with patchy pneumonia possibly resolving in view of the patient's history. However continued follow-up is recommended to ensure complete clearing. The right lung appears clear. No pleural effusion is seen. Mediastinal and  hilar contours are unremarkable. The heart is within normal limits in size. No bony abnormality is seen IMPRESSION: Vague opacity in the left mid lung most consistent with pneumonia possibly residual after interval treatment. Recommend continued follow-up to ensure clearing. . Electronically Signed   By: Dwyane Dee M.D.   On: 03/03/2018 17:19    Procedures Procedures (including critical care time)  Medications Ordered in ED Medications - No data to display   Initial Impression / Assessment and Plan / ED Course  I have reviewed the triage vital signs and the nursing notes. 22 y.o. female here with c/o developing a rash while taking antibiotics for pneumonia. Patient stable for d/c without rash or itching after stopping the medication 2 days ago. Patient has area of residual pneumonia on x-ray. Patient continued to have cough and congestion will Rx Z-pak and she will f/u with her PCP or return her for worsening symptoms.   Final Clinical Impressions(s) / ED Diagnoses   Final diagnoses:  Medication reaction, initial encounter  Pneumonia of left lung due to infectious organism, unspecified part of lung    ED Discharge Orders         Ordered    azithromycin (ZITHROMAX Z-PAK) 250 MG tablet     03/03/18 1750           Kerrie Buffalo Beaver, NP 03/03/18 1911    Margarita Grizzle, MD 03/04/18 [phone]

## 2018-03-03 NOTE — Discharge Instructions (Addendum)
Do not take any more of the Levaquin antibiotic. Take the medication we prescribe for you. Follow up with your doctor in the next week or so for recheck. Return as needed for worsening symptoms. Take benadryl as needed for itching or allergic reaction.

## 2018-03-03 NOTE — ED Triage Notes (Signed)
Pt reports red raised rash right after taking her medication for pneumonia . She started medication 5 days ago and noted itching and rash. She stopped taking the antibiotic and cough medicine, itching persists

## 2018-05-11 ENCOUNTER — Ambulatory Visit (HOSPITAL_COMMUNITY)
Admission: EM | Admit: 2018-05-11 | Discharge: 2018-05-11 | Disposition: A | Discharge status: Home / Self Care | Payer: Self-pay | Attending: Family Medicine | Admitting: Family Medicine

## 2018-05-11 ENCOUNTER — Encounter (HOSPITAL_COMMUNITY): Payer: Self-pay

## 2018-05-11 DIAGNOSIS — M545 Low back pain, unspecified: Secondary | ICD-10-CM

## 2018-05-11 LAB — POCT PREGNANCY, URINE: Preg Test, Ur: NEGATIVE

## 2018-05-11 LAB — POCT URINALYSIS DIP (DEVICE)
Bilirubin Urine: NEGATIVE
Glucose, UA: NEGATIVE mg/dL
HGB URINE DIPSTICK: NEGATIVE
KETONES UR: NEGATIVE mg/dL
Leukocytes,Ua: NEGATIVE
Nitrite: NEGATIVE
Protein, ur: NEGATIVE mg/dL
SPECIFIC GRAVITY, URINE: 1.015 (ref 1.005–1.030)
UROBILINOGEN UA: 0.2 mg/dL (ref 0.0–1.0)
pH: 8.5 — ABNORMAL HIGH (ref 5.0–8.0)

## 2018-05-11 MED ORDER — CYCLOBENZAPRINE HCL 10 MG PO TABS: 5.0000 mg | ORAL_TABLET | Freq: Every day | ORAL | 0 refills | Status: DC

## 2018-05-11 MED ORDER — NAPROXEN 500 MG PO TABS: 500.0000 mg | ORAL_TABLET | Freq: Two times a day (BID) | ORAL | 0 refills | Status: DC

## 2018-05-11 NOTE — ED Triage Notes (Signed)
Pt presents with chronic back pain but states the last 2 days it has been at its worse.

## 2018-05-11 NOTE — ED Provider Notes (Signed)
[facility]    CSN: [id number] Arrival date & time: 05/11/18  1356     History   Chief Complaint Chief Complaint  Patient presents with  . Back Pain    HPI Tamera StandsGlodie Encarnacion is a 23 y.o. female.   Patient is a 23 year old female that presents with lower back pain.  This started approximately 2 days ago and has worsened.  She is having discomfort with all movements and range of motion.  She has had intermittent sporadic back pain ever since having her child.  She took Tylenol without any relief of pain she did have some vaginal discharge yesterday but that has resolved today.  She denies any foul odor with it.  She has not been sexually active in 2 years.  Denies any concern for STDs.  No urinary symptoms.  No fevers, chills, nausea, vomiting, diarrhea.  No injuries or strenuous exercise. .  No numbness, tingling or saddle paresthesias. Patient's last menstrual period was 04/28/2018.  ROS per HPI      Past Medical History:  Diagnosis Date  . Medical history non-contributory     There are no active problems to display for this patient.   Past Surgical History:  Procedure Laterality Date  . APPENDECTOMY      OB History    Gravida  2   Para  1   Term  1   Preterm      AB  1   Living  1     SAB      TAB  1   Ectopic      Multiple      Live Births               Home Medications    Prior to Admission medications   Medication Sig Start Date End Date Taking? Authorizing Provider  acetaminophen (TYLENOL) 650 MG CR tablet Take 650 mg by mouth every 8 (eight) hours as needed for pain.    [provider]  azithromycin (ZITHROMAX Z-PAK) 250 MG tablet Take the first 2 tablets today and then one tablet PO daily. 03/03/18   Janne NapoleonNeese, Hope M, NP  cyclobenzaprine (FLEXERIL) 10 MG tablet Take 0.5 tablets (5 mg total) by mouth at bedtime. 05/11/18   Deshanae Lindo, Gloris Manchesterraci A, NP  fluticasone (FLONASE) 50 MCG/ACT nasal spray Place 1 spray into both nostrils  daily. 12/28/17   Georgetta HaberBurky, Natalie B, NP  ibuprofen (ADVIL,MOTRIN) 800 MG tablet Take 1 tablet (800 mg total) by mouth 3 (three) times daily. 12/28/17   Georgetta HaberBurky, Natalie B, NP  megestrol (MEGACE) 40 MG tablet Take 1 tablet (40 mg total) by mouth 2 (two) times daily as needed. 02/17/18   Judeth HornLawrence, Erin, NP  naproxen (NAPROSYN) 500 MG tablet Take 1 tablet (500 mg total) by mouth 2 (two) times daily. 05/11/18   Janace ArisBast, Delany Steury A, NP    Family History Family History  Problem Relation Age of Onset  . Hypertension Mother   . Diabetes Father     Social History Social History   Tobacco Use  . Smoking status: Never Smoker  . Smokeless tobacco: Never Used  Substance Use Topics  . Alcohol use: Never    Frequency: Never  . Drug use: Never     Allergies   Zosyn [piperacillin sod-tazobactam so]   Review of Systems Review of Systems   Physical Exam Triage Vital Signs ED Triage Vitals  Enc Vitals Group     BP 05/11/18 1500 128/60  Pulse Rate 05/11/18 1500 85     Resp 05/11/18 1500 18     Temp 05/11/18 1500 97.9 F (36.6 C)     Temp Source 05/11/18 1500 Oral     SpO2 05/11/18 1500 97 %     Weight --      Height --      Head Circumference --      Peak Flow --      Pain Score 05/11/18 1501 10     Pain Loc --      Pain Edu? --      Excl. in GC? --    No data found.  Updated Vital Signs BP 128/60 (BP Location: Right Arm)   Pulse 85   Temp 97.9 F (36.6 C) (Oral)   Resp 18   LMP 04/28/2018   SpO2 97%   Visual Acuity Right Eye Distance:   Left Eye Distance:   Bilateral Distance:    Right Eye Near:   Left Eye Near:    Bilateral Near:     Physical Exam Constitutional:      Comments: Appears in pain holding her back. Uncomfortable on the exam table and laying flat.   HENT:     Head: Normocephalic and atraumatic.     Nose: Nose normal.  Eyes:     Conjunctiva/sclera: Conjunctivae normal.  Pulmonary:     Effort: Pulmonary effort is normal.  Musculoskeletal:         General: Tenderness present.     Comments: TTP lower lumbar paravertebral muscles.   Skin:    General: Skin is warm and dry.  Neurological:     Mental Status: She is alert.  Psychiatric:        Mood and Affect: Mood normal.      UC Treatments / Results  Labs (all labs ordered are listed, but only abnormal results are displayed) Labs Reviewed  POCT URINALYSIS DIP (DEVICE) - Abnormal; Notable for the following components:      Result Value   pH 8.5 (*)    All other components within normal limits  POC URINE PREG, ED  POCT PREGNANCY, URINE    EKG None  Radiology No results found.  Procedures Procedures (including critical care time)  Medications Ordered in UC Medications - No data to display  Initial Impression / Assessment and Plan / UC Course  I have reviewed the triage vital signs and the nursing notes.  Pertinent labs & imaging results that were available during my care of the patient were reviewed by me and considered in my medical decision making (see chart for details).     Patient is a 23 year old female who comes in with lower back pain that is worse than usual.  There is no injury It is not appear to be radiculopathy Urine is negative for any infection We will have her take naproxen twice a day with food for pain inflammation Flexeril as needed for muscle relaxant.  Instructions on drowsiness. Follow up as needed for continued or worsening symptoms  Final Clinical Impressions(s) / UC Diagnoses   Final diagnoses:  Acute bilateral low back pain without sciatica     Discharge Instructions     Your urine did not show any infection We will treat your lower back pain with naproxen twice a day with food for pain inflammation Flexeril as needed for muscle spasm Follow up as needed for continued or worsening symptoms     ED Prescriptions    Medication Sig Dispense  Auth. Provider   naproxen (NAPROSYN) 500 MG tablet Take 1 tablet (500 mg total) by  mouth 2 (two) times daily. 30 tablet Katelyn Kohlmeyer A, NP   cyclobenzaprine (FLEXERIL) 10 MG tablet Take 0.5 tablets (5 mg total) by mouth at bedtime. 20 tablet Dahlia Byes A, NP     Controlled Substance Prescriptions [place] Controlled Substance Registry consulted? Not Applicable   Janace Aris, NP 05/11/18 1559

## 2018-05-11 NOTE — Discharge Instructions (Signed)
Your urine did not show any infection We will treat your lower back pain with naproxen twice a day with food for pain inflammation Flexeril as needed for muscle spasm Follow up as needed for continued or worsening symptoms

## 2018-05-14 ENCOUNTER — Other Ambulatory Visit (HOSPITAL_COMMUNITY): Payer: Self-pay

## 2018-05-14 ENCOUNTER — Emergency Department (HOSPITAL_COMMUNITY): Payer: Self-pay

## 2018-05-14 ENCOUNTER — Encounter (HOSPITAL_COMMUNITY): Payer: Self-pay

## 2018-05-14 ENCOUNTER — Other Ambulatory Visit: Payer: Self-pay

## 2018-05-14 ENCOUNTER — Emergency Department (HOSPITAL_COMMUNITY)
Admission: EM | Admit: 2018-05-14 | Discharge: 2018-05-14 | Disposition: A | Discharge status: Home / Self Care | Payer: Self-pay | Attending: Emergency Medicine | Admitting: Emergency Medicine

## 2018-05-14 DIAGNOSIS — N83202 Unspecified ovarian cyst, left side: Secondary | ICD-10-CM | POA: Insufficient documentation

## 2018-05-14 DIAGNOSIS — R102 Pelvic and perineal pain: Secondary | ICD-10-CM | POA: Insufficient documentation

## 2018-05-14 LAB — WET PREP, GENITAL
Clue Cells Wet Prep HPF POC: NONE SEEN
Sperm: NONE SEEN
Trich, Wet Prep: NONE SEEN

## 2018-05-14 LAB — URINALYSIS, ROUTINE W REFLEX MICROSCOPIC
Bilirubin Urine: NEGATIVE
Glucose, UA: NEGATIVE mg/dL
Hgb urine dipstick: NEGATIVE
Ketones, ur: NEGATIVE mg/dL
Leukocytes,Ua: NEGATIVE
Nitrite: NEGATIVE
Protein, ur: NEGATIVE mg/dL
Specific Gravity, Urine: 1.021 (ref 1.005–1.030)
pH: 8 (ref 5.0–8.0)

## 2018-05-14 LAB — CBC WITH DIFFERENTIAL/PLATELET
Abs Immature Granulocytes: 0.02 10*3/uL (ref 0.00–0.07)
Basophils Absolute: 0 10*3/uL (ref 0.0–0.1)
Basophils Relative: 1 %
Eosinophils Absolute: 0.1 10*3/uL (ref 0.0–0.5)
Eosinophils Relative: 1 %
HCT: 35.4 % — ABNORMAL LOW (ref 36.0–46.0)
Hemoglobin: 12.1 g/dL (ref 12.0–15.0)
Immature Granulocytes: 0 %
Lymphocytes Relative: 24 %
Lymphs Abs: 1.6 10*3/uL (ref 0.7–4.0)
MCH: 32.3 pg (ref 26.0–34.0)
MCHC: 34.2 g/dL (ref 30.0–36.0)
MCV: 94.4 fL (ref 80.0–100.0)
Monocytes Absolute: 0.4 10*3/uL (ref 0.1–1.0)
Monocytes Relative: 6 %
Neutro Abs: 4.4 10*3/uL (ref 1.7–7.7)
Neutrophils Relative %: 68 %
Platelets: 212 10*3/uL (ref 150–400)
RBC: 3.75 MIL/uL — ABNORMAL LOW (ref 3.87–5.11)
RDW: 12.1 % (ref 11.5–15.5)
WBC: 6.5 10*3/uL (ref 4.0–10.5)
nRBC: 0 % (ref 0.0–0.2)

## 2018-05-14 LAB — BASIC METABOLIC PANEL
Anion gap: 7 (ref 5–15)
BUN: 8 mg/dL (ref 6–20)
CO2: 27 mmol/L (ref 22–32)
Calcium: 9.5 mg/dL (ref 8.9–10.3)
Chloride: 104 mmol/L (ref 98–111)
Creatinine, Ser: 0.68 mg/dL (ref 0.44–1.00)
GFR calc Af Amer: >60 mL/min (ref >60)
GFR calc non Af Amer: >60 mL/min (ref >60)
Glucose, Bld: 76 mg/dL (ref 70–99)
Potassium: 3.7 mmol/L (ref 3.5–5.1)
Sodium: 138 mmol/L (ref 135–145)

## 2018-05-14 LAB — PREGNANCY, URINE: Preg Test, Ur: NEGATIVE

## 2018-05-14 LAB — POC URINE PREG, ED: Preg Test, Ur: NEGATIVE

## 2018-05-14 MED ORDER — NAPROXEN 500 MG PO TABS: 500.0000 mg | ORAL_TABLET | Freq: Two times a day (BID) | ORAL | 0 refills | Status: DC

## 2018-05-14 MED ORDER — OXYCODONE-ACETAMINOPHEN 5-325 MG PO TABS
1.0000 | ORAL_TABLET | Freq: Once | ORAL | Status: AC
  Administered 2018-05-14: 1 via ORAL
  Filled 2018-05-14: qty 1

## 2018-05-14 MED ORDER — KETOROLAC TROMETHAMINE 30 MG/ML IJ SOLN
30.0000 mg | Freq: Once | INTRAMUSCULAR | Status: AC
  Administered 2018-05-14: 30 mg via INTRAMUSCULAR
  Filled 2018-05-14: qty 1

## 2018-05-14 NOTE — ED Provider Notes (Addendum)
[facility] EMERGENCY DEPARTMENT Provider Note   CSN: [id number] Arrival date & time: 05/14/18  1404    History   Chief Complaint Chief Complaint  Patient presents with  . Pelvic Pain    HPI Tracie Howard is a 23 y.o. female.       HPI   23 year old female presents today with acute onset pelvic pain.  Patient notes approximately 1 hour ago she developed pain in her vagina, squeezing in nature.  She denies any lateralization.  She denies any upper abdominal pain nausea vomiting or fever.  She notes she is not sexually active and has not been in 2 years.  She notes she is not pregnant.  She denies any fever vaginal discharge or bleeding.  Her last normal menstrual cycle was February 4. No dysuria.    Past Medical History:  Diagnosis Date  . Medical history non-contributory     There are no active problems to display for this patient.   Past Surgical History:  Procedure Laterality Date  . APPENDECTOMY       OB History    Gravida  2   Para  1   Term  1   Preterm      AB  1   Living  1     SAB      TAB  1   Ectopic      Multiple      Live Births               Home Medications    Prior to Admission medications   Medication Sig Start Date End Date Taking? Authorizing Provider  acetaminophen (TYLENOL) 650 MG CR tablet Take 650 mg by mouth every 8 (eight) hours as needed for pain.    [provider]  azithromycin (ZITHROMAX Z-PAK) 250 MG tablet Take the first 2 tablets today and then one tablet PO daily. 03/03/18   Janne Napoleon, NP  cyclobenzaprine (FLEXERIL) 10 MG tablet Take 0.5 tablets (5 mg total) by mouth at bedtime. 05/11/18   Bast, Gloris Manchester A, NP  fluticasone (FLONASE) 50 MCG/ACT nasal spray Place 1 spray into both nostrils daily. 12/28/17   Georgetta Haber, NP  ibuprofen (ADVIL,MOTRIN) 800 MG tablet Take 1 tablet (800 mg total) by mouth 3 (three) times daily. 12/28/17   Georgetta Haber, NP  megestrol (MEGACE) 40 MG  tablet Take 1 tablet (40 mg total) by mouth 2 (two) times daily as needed. 02/17/18   Judeth Horn, NP  naproxen (NAPROSYN) 500 MG tablet Take 1 tablet (500 mg total) by mouth 2 (two) times daily. 05/11/18   Janace Aris, NP    Family History Family History  Problem Relation Age of Onset  . Hypertension Mother   . Diabetes Father     Social History Social History   Tobacco Use  . Smoking status: Never Smoker  . Smokeless tobacco: Never Used  Substance Use Topics  . Alcohol use: Never    Frequency: Never  . Drug use: Never     Allergies   Zosyn [piperacillin sod-tazobactam so]   Review of Systems Review of Systems  All other systems reviewed and are negative.   Physical Exam Updated Vital Signs BP (!) 110/57 (BP Location: Right Arm)   Pulse 73   Temp 98.2 F (36.8 C) (Oral)   Resp 18   LMP 04/28/2018   SpO2 100%   Physical Exam Vitals signs and nursing note reviewed.  Constitutional:  Appearance: She is well-developed.  HENT:     Head: Normocephalic and atraumatic.  Eyes:     General: No scleral icterus.       Right eye: No discharge.        Left eye: No discharge.     Conjunctiva/sclera: Conjunctivae normal.     Pupils: Pupils are equal, round, and reactive to light.  Neck:     Musculoskeletal: Normal range of motion.     Vascular: No JVD.     Trachea: No tracheal deviation.  Pulmonary:     Effort: Pulmonary effort is normal.     Breath sounds: No stridor.  Genitourinary:    Comments: Tenderness palpation of the pelvic region, nonfocal, no abdominal tenderness Neurological:     Mental Status: She is alert and oriented to person, place, and time.     Coordination: Coordination normal.  Psychiatric:        Behavior: Behavior normal.        Thought Content: Thought content normal.        Judgment: Judgment normal.     ED Treatments / Results  Labs (all labs ordered are listed, but only abnormal results are displayed) Labs Reviewed  WET  PREP, GENITAL - Abnormal; Notable for the following components:      Result Value   Yeast Wet Prep HPF POC PRESENT (*)    WBC, Wet Prep HPF POC FEW (*)    All other components within normal limits  CBC WITH DIFFERENTIAL/PLATELET  BASIC METABOLIC PANEL  URINALYSIS, ROUTINE W REFLEX MICROSCOPIC  POC URINE PREG, ED  GC/CHLAMYDIA PROBE AMP ([place]) NOT AT [facility]    EKG None  Radiology No results found.  Procedures Procedures (including critical care time)  Medications Ordered in ED Medications  oxyCODONE-acetaminophen (PERCOCET/ROXICET) 5-325 MG per tablet 1 tablet (1 tablet Oral Given 05/14/18 1446)     Initial Impression / Assessment and Plan / ED Course  I have reviewed the triage vital signs and the nursing notes.  Pertinent labs & imaging results that were available during my care of the patient were reviewed by me and considered in my medical decision making (see chart for details).        Labs: Prep, GC, CBC, BMP, urinalysis, point-of-care urine pregnant  Imaging: Ultrasound pelvis  Consults:  Therapeutics: percocet   Discharge Meds:   Assessment/Plan: 23 year old female presents today with acute onset pelvic pain.  Patient will need torsion rule out along with ongoing work-up.  She has no signs of pelvic inflammatory disease as she has no vaginal discharge.  Patient care signed to oncoming provider pending results.     Final Clinical Impressions(s) / ED Diagnoses   Final diagnoses:  Pelvic pain    ED Discharge Orders    None         Rosalio Loud 05/14/18 1610    Geoffery Lyons, MD 05/23/18 [phone]

## 2018-05-14 NOTE — ED Triage Notes (Signed)
Pt coming from home with complaining of pelvic pain onset approximately 1 hour ago. LMP 2/4. Hx of appendicitis, denies any discharge or foul odor.

## 2018-05-14 NOTE — ED Provider Notes (Addendum)
1605: Pt handed off to oncoming EDPA pending pelvic US and UA. Please see previous note on full details.  Briefly, pt here for sudden suprapubic pain sudden onset 2-3 hr PTA, radiates into vaginal opening.  Worse with moving, urinating, palpation. Slightly better when she passes gas.  H/o appendectomy.   No fever, nausea, vomiting, changes to BM (last BM normal yesterday), abnormal vaginal discharge or bleeding. LMP earlier this month. Not sexually active in more than 2 years. No vaginal trauma, recent intercourse.  Physical Exam  BP (!) 110/57 (BP Location: Right Arm)   Pulse 73   Temp 98.2 F (36.8 C) (Oral)   Resp 18   LMP 04/28/2018   SpO2 100%   Physical Exam Constitutional:      Appearance: She is well-developed. She is not toxic-appearing.  HENT:     Head: Normocephalic.     Right Ear: External ear normal.     Left Ear: External ear normal.     Nose: Nose normal.  Eyes:     Conjunctiva/sclera: Conjunctivae normal.  Neck:     Musculoskeletal: Full passive range of motion without pain.  Cardiovascular:     Rate and Rhythm: Normal rate.  Pulmonary:     Effort: Pulmonary effort is normal. No tachypnea or respiratory distress.  Abdominal:     General: Abdomen is flat.     Palpations: Abdomen is soft.     Tenderness: There is abdominal tenderness.     Comments: Focal suprapubic tenderness.  Negative murphy's and mcburney's.  No G/R/R. No CVAT. Active BS to lower quadrants.   Genitourinary:    Comments:  Pelvic exam per previous EDPA. No CMT noted.  Musculoskeletal: Normal range of motion.  Skin:    General: Skin is warm and dry.     Capillary Refill: Capillary refill takes less than 2 seconds.  Neurological:     Mental Status: She is alert and oriented to person, place, and time.  Psychiatric:        Behavior: Behavior normal.        Thought Content: Thought content normal.     ED Course/Procedures   Clinical Course as of May 14 1805  Thu May 14, 2018  1720  IMPRESSION: 1. Normal sonographic appearance of the uterus. 2. The right ovary is normal. 3. Complex multi septated cystic structure associated with the left ovary measuring 4.4 x 3.0 x 3.3 cm. This is most likely a complex cyst. There is also a moderate amount of free pelvic fluid and it is possible that this cyst partially ruptured. Follow-up pelvic ultrasound in 3-4 months is suggested.  US Pelvis Complete [CG]    Clinical Course User Index [CG] Liberty Handy, PA-C    Procedures  MDM   [phone]: ddx includes UTI.  Pending urine pregnancy, [place], UA.  Also considering torsion vs ectopic pregnancy.  Less likely renal stone given symptoms, but will wait for UA to determine if further imaging needs to be done.  I have lower suspicion for acute GI etiology such as diverticulitis, SBO.    1805: Pelvic US shows likely ruptured left ovarian cyst. This fits clinical picture.  UA w/o infection. Discussed result with pt.  She will be given toradol IM and dc with naproxen and 1 g tylenol as needed. Return precautions given.     Liberty Handy, PA-C 05/14/18 Cynda Familia, MD 05/14/18 [phone]

## 2018-05-14 NOTE — Discharge Instructions (Signed)
You were seen in the ER for sudden pelvic pain.  Lab work is reassuring.  Ultrasound showed likely a complex ovarian cyst that has ruptured.  This is likely the cause of your pain.  Treatment of this includes anti-inflammatories.  Take naproxen twice a day as prescribed.  For more pain control you can add 500 to 1000 mg of acetaminophen (Tylenol) every 6 hours.  Typically pain related to ruptured cyst slowly improves over 7 to 10 days.  Return to the ER for worsening, local pelvic pain, fever, chills, abnormal vaginal discharge or bleeding.

## 2018-05-15 LAB — GC/CHLAMYDIA PROBE AMP (~~LOC~~) NOT AT ARMC
Chlamydia: NEGATIVE
Neisseria Gonorrhea: NEGATIVE

## 2018-05-16 ENCOUNTER — Emergency Department (HOSPITAL_COMMUNITY): Payer: Self-pay

## 2018-05-16 ENCOUNTER — Other Ambulatory Visit: Payer: Self-pay

## 2018-05-16 ENCOUNTER — Emergency Department (HOSPITAL_COMMUNITY)
Admission: EM | Admit: 2018-05-16 | Discharge: 2018-05-16 | Disposition: A | Discharge status: Home / Self Care | Payer: Self-pay | Attending: Emergency Medicine | Admitting: Emergency Medicine

## 2018-05-16 ENCOUNTER — Encounter (HOSPITAL_COMMUNITY): Payer: Self-pay | Admitting: Emergency Medicine

## 2018-05-16 ENCOUNTER — Other Ambulatory Visit (HOSPITAL_COMMUNITY): Payer: Self-pay

## 2018-05-16 DIAGNOSIS — N83201 Unspecified ovarian cyst, right side: Secondary | ICD-10-CM | POA: Insufficient documentation

## 2018-05-16 DIAGNOSIS — N83209 Unspecified ovarian cyst, unspecified side: Secondary | ICD-10-CM

## 2018-05-16 DIAGNOSIS — R10815 Periumbilic abdominal tenderness: Secondary | ICD-10-CM | POA: Insufficient documentation

## 2018-05-16 LAB — CBC WITH DIFFERENTIAL/PLATELET
Abs Immature Granulocytes: 0.02 10*3/uL (ref 0.00–0.07)
Basophils Absolute: 0 10*3/uL (ref 0.0–0.1)
Basophils Relative: 0 %
Eosinophils Absolute: 0 10*3/uL (ref 0.0–0.5)
Eosinophils Relative: 0 %
HEMATOCRIT: 37.1 % (ref 36.0–46.0)
Hemoglobin: 12.5 g/dL (ref 12.0–15.0)
Immature Granulocytes: 0 %
LYMPHS ABS: 1.3 10*3/uL (ref 0.7–4.0)
Lymphocytes Relative: 16 %
MCH: 31.6 pg (ref 26.0–34.0)
MCHC: 33.7 g/dL (ref 30.0–36.0)
MCV: 93.7 fL (ref 80.0–100.0)
Monocytes Absolute: 0.3 10*3/uL (ref 0.1–1.0)
Monocytes Relative: 4 %
NRBC: 0 % (ref 0.0–0.2)
Neutro Abs: 6.1 10*3/uL (ref 1.7–7.7)
Neutrophils Relative %: 80 %
PLATELETS: 232 10*3/uL (ref 150–400)
RBC: 3.96 MIL/uL (ref 3.87–5.11)
RDW: 11.9 % (ref 11.5–15.5)
WBC: 7.8 10*3/uL (ref 4.0–10.5)

## 2018-05-16 LAB — COMPREHENSIVE METABOLIC PANEL WITH GFR
ALT: 12 U/L (ref 0–44)
AST: 15 U/L (ref 15–41)
Albumin: 4.2 g/dL (ref 3.5–5.0)
Alkaline Phosphatase: 43 U/L (ref 38–126)
Anion gap: 7 (ref 5–15)
BUN: 9 mg/dL (ref 6–20)
CO2: 22 mmol/L (ref 22–32)
Calcium: 9.7 mg/dL (ref 8.9–10.3)
Chloride: 108 mmol/L (ref 98–111)
Creatinine, Ser: 0.75 mg/dL (ref 0.44–1.00)
GFR calc Af Amer: >60 mL/min (ref >60)
GFR calc non Af Amer: >60 mL/min (ref >60)
Glucose, Bld: 100 mg/dL — ABNORMAL HIGH (ref 70–99)
Potassium: 3.8 mmol/L (ref 3.5–5.1)
Sodium: 137 mmol/L (ref 135–145)
Total Bilirubin: 0.7 mg/dL (ref 0.3–1.2)
Total Protein: 7.6 g/dL (ref 6.5–8.1)

## 2018-05-16 LAB — I-STAT BETA HCG BLOOD, ED (MC, WL, AP ONLY): I-stat hCG, quantitative: <5 m[IU]/mL (ref <5)

## 2018-05-16 MED ORDER — MORPHINE SULFATE (PF) 4 MG/ML IV SOLN
4.0000 mg | Freq: Once | INTRAVENOUS | Status: AC
  Administered 2018-05-16: 4 mg via INTRAVENOUS
  Filled 2018-05-16: qty 1

## 2018-05-16 MED ORDER — KETOROLAC TROMETHAMINE 30 MG/ML IJ SOLN
30.0000 mg | Freq: Once | INTRAMUSCULAR | Status: DC
  Filled 2018-05-16: qty 1

## 2018-05-16 MED ORDER — SODIUM CHLORIDE 0.9 % IV BOLUS
1000.0000 mL | Freq: Once | INTRAVENOUS | Status: AC
  Administered 2018-05-16: 1000 mL via INTRAVENOUS

## 2018-05-16 MED ORDER — ONDANSETRON HCL 4 MG/2ML IJ SOLN
4.0000 mg | Freq: Once | INTRAMUSCULAR | Status: AC
  Administered 2018-05-16: 4 mg via INTRAVENOUS
  Filled 2018-05-16: qty 2

## 2018-05-16 NOTE — ED Triage Notes (Signed)
Patient reports pelvic pain for 2 days.  Seen 2 days ago for same.  Reports cyst on left ovary.  Reports vomiting, denies vaginal discharge, bleeding.

## 2018-05-16 NOTE — ED Provider Notes (Signed)
[facility] EMERGENCY DEPARTMENT Provider Note   CSN: [id number] Arrival date & time: 05/16/18  1155    History   Chief Complaint Chief Complaint  Patient presents with  . Pelvic Pain    HPI Tracie Howard is a 23 y.o. female.     Patient c/o lower abd/pelvic area pain for the past 2-3 days. Symptoms acute onset, mod-severe, persistent. Was seen in ED 2 days ago w same and told had ovarian cyst/?ruptured. Patients pain acutely worse this AM. Severe. Constant. Dull. Non radiating. No vaginal bleeding or discharge. No back/flank pain. No fever or chills. Nausea. No vomiting. Having normal bms. Last sexually active 2 years+ ago. States hx prior appendectomy.   The history is provided by the patient.  Pelvic Pain  Pertinent negatives include no chest pain, no headaches and no shortness of breath.    Past Medical History:  Diagnosis Date  . Medical history non-contributory     There are no active problems to display for this patient.   Past Surgical History:  Procedure Laterality Date  . APPENDECTOMY       OB History    Gravida  2   Para  1   Term  1   Preterm      AB  1   Living  1     SAB      TAB  1   Ectopic      Multiple      Live Births               Home Medications    Prior to Admission medications   Medication Sig Start Date End Date Taking? Authorizing Provider  azithromycin (ZITHROMAX Z-PAK) 250 MG tablet Take the first 2 tablets today and then one tablet PO daily. Patient not taking: Reported on 05/14/2018 03/03/18   Janne Napoleon, NP  cyclobenzaprine (FLEXERIL) 10 MG tablet Take 0.5 tablets (5 mg total) by mouth at bedtime. Patient not taking: Reported on 05/14/2018 05/11/18   Dahlia Byes A, NP  fluticasone (FLONASE) 50 MCG/ACT nasal spray Place 1 spray into both nostrils daily. Patient not taking: Reported on 05/14/2018 12/28/17   Linus Mako B, NP  ibuprofen (ADVIL,MOTRIN) 800 MG tablet Take 1 tablet (800 mg  total) by mouth 3 (three) times daily. Patient not taking: Reported on 05/14/2018 12/28/17   Georgetta Haber, NP  megestrol (MEGACE) 40 MG tablet Take 1 tablet (40 mg total) by mouth 2 (two) times daily as needed. Patient not taking: Reported on 05/14/2018 02/17/18   Judeth Horn, NP  naproxen (NAPROSYN) 500 MG tablet Take 1 tablet (500 mg total) by mouth 2 (two) times daily. 05/14/18   Liberty Handy, PA-C    Family History Family History  Problem Relation Age of Onset  . Hypertension Mother   . Diabetes Father     Social History Social History   Tobacco Use  . Smoking status: Never Smoker  . Smokeless tobacco: Never Used  Substance Use Topics  . Alcohol use: Never    Frequency: Never  . Drug use: Never     Allergies   Zosyn [piperacillin sod-tazobactam so]   Review of Systems Review of Systems  Constitutional: Negative for fever.  HENT: Negative for sore throat.   Eyes: Negative for redness.  Respiratory: Negative for shortness of breath.   Cardiovascular: Negative for chest pain.  Gastrointestinal: Negative for vomiting.  Endocrine: Negative for polyuria.  Genitourinary: Positive for pelvic pain. Negative for  dysuria and flank pain.  Musculoskeletal: Negative for back pain and neck pain.  Skin: Negative for rash.  Neurological: Negative for headaches.  Hematological: Does not bruise/bleed easily.  Psychiatric/Behavioral: Negative for confusion.     Physical Exam Updated Vital Signs BP 113/71   Pulse 68   Temp 98.4 F (36.9 C) (Oral)   Resp 16   Ht 1.702 m ( )   Wt 73.9 kg   LMP 04/28/2018   SpO2 100%   BMI 25.53 kg/m   Physical Exam Vitals signs and nursing note reviewed.  Constitutional:      Appearance: Normal appearance. She is well-developed.  HENT:     Head: Atraumatic.     Nose: Nose normal.     Mouth/Throat:     Mouth: Mucous membranes are moist.  Eyes:     General: No scleral icterus.    Conjunctiva/sclera: Conjunctivae  normal.     Pupils: Pupils are equal, round, and reactive to light.  Neck:     Musculoskeletal: Normal range of motion and neck supple. No neck rigidity or muscular tenderness.     Trachea: No tracheal deviation.  Cardiovascular:     Rate and Rhythm: Normal rate and regular rhythm.     Pulses: Normal pulses.     Heart sounds: Normal heart sounds. No murmur. No friction rub. No gallop.   Pulmonary:     Effort: Pulmonary effort is normal. No respiratory distress.     Breath sounds: Normal breath sounds.  Abdominal:     General: Bowel sounds are normal. There is no distension.     Palpations: Abdomen is soft. There is no mass.     Tenderness: There is abdominal tenderness. There is no guarding.     Comments: +suprapubic and bil pelvic tenderness.   Genitourinary:    Comments: No cva tenderness. Pelvic, no bleeding. No purulent discharge. No cmt/no chand sign.   Musculoskeletal:        General: No swelling.  Skin:    General: Skin is warm and dry.     Findings: No rash.  Neurological:     Mental Status: She is alert.     Comments: Alert, speech normal.   Psychiatric:        Mood and Affect: Mood normal.      ED Treatments / Results  Labs (all labs ordered are listed, but only abnormal results are displayed) Results for orders placed or performed during the hospital encounter of 05/16/18  CBC with Differential/Platelet  Result Value Ref Range   WBC 7.8 4.0 - 10.5 K/uL   RBC 3.96 3.87 - 5.11 MIL/uL   Hemoglobin 12.5 12.0 - 15.0 g/dL   HCT 78.2 95.6 - 21.3 %   MCV 93.7 80.0 - 100.0 fL   MCH 31.6 26.0 - 34.0 pg   MCHC 33.7 30.0 - 36.0 g/dL   RDW 08.6 57.8 - 46.9 %   Platelets 232 150 - 400 K/uL   nRBC 0.0 0.0 - 0.2 %   Neutrophils Relative % 80 %   Neutro Abs 6.1 1.7 - 7.7 K/uL   Lymphocytes Relative 16 %   Lymphs Abs 1.3 0.7 - 4.0 K/uL   Monocytes Relative 4 %   Monocytes Absolute 0.3 0.1 - 1.0 K/uL   Eosinophils Relative 0 %   Eosinophils Absolute 0.0 0.0 - 0.5 K/uL     Basophils Relative 0 %   Basophils Absolute 0.0 0.0 - 0.1 K/uL   Immature Granulocytes 0 %  Abs Immature Granulocytes 0.02 0.00 - 0.07 K/uL  Comprehensive metabolic panel  Result Value Ref Range   Sodium 137 135 - 145 mmol/L   Potassium 3.8 3.5 - 5.1 mmol/L   Chloride 108 98 - 111 mmol/L   CO2 22 22 - 32 mmol/L   Glucose, Bld 100 (H) 70 - 99 mg/dL   BUN 9 6 - 20 mg/dL   Creatinine, Ser 1.61 0.44 - 1.00 mg/dL   Calcium 9.7 8.9 - 09.6 mg/dL   Total Protein 7.6 6.5 - 8.1 g/dL   Albumin 4.2 3.5 - 5.0 g/dL   AST 15 15 - 41 U/L   ALT 12 0 - 44 U/L   Alkaline Phosphatase 43 38 - 126 U/L   Total Bilirubin 0.7 0.3 - 1.2 mg/dL   GFR calc non Af Amer >60 >60 mL/min   GFR calc Af Amer >60 >60 mL/min   Anion gap 7 5 - 15  I-Stat Beta hCG blood, ED ([facility], [facility], [facility] only)  Result Value Ref Range   I-stat hCG, quantitative <5.0 <5 mIU/mL   Comment 3           US Transvaginal Non-ob  Result Date: 05/14/2018 CLINICAL DATA:  One day history of pelvic pain. EXAM: TRANSABDOMINAL AND TRANSVAGINAL ULTRASOUND OF PELVIS DOPPLER ULTRASOUND OF OVARIES TECHNIQUE: Both transabdominal and transvaginal ultrasound examinations of the pelvis were performed. Transabdominal technique was performed for global imaging of the pelvis including uterus, ovaries, adnexal regions, and pelvic cul-de-sac. It was necessary to proceed with endovaginal exam following the transabdominal exam to visualize the ovaries and endometrium. Color and duplex Doppler ultrasound was utilized to evaluate blood flow to the ovaries. COMPARISON:  None. FINDINGS: Uterus Measurements: 9.1 x 5.5 x 5.0 cm = volume: 130.7 mL. No myometrial abnormalities. Endometrium Thickness: 8.0.  No focal abnormality visualized. Right ovary Measurements: 2.4 x 1.0 x 1.6 cm = volume: 2.1 mL. Normal appearance/no adnexal mass. Left ovary Measurements: 5.2 x 3.6 x 4.3 cm = volume: 42.3 mL. There is a complex multi septated cystic lesion measuring 4.4 x 3.0 x 3.3 cm.  Pulsed Doppler evaluation of both ovaries demonstrates normal low-resistance arterial and venous waveforms. Other findings Moderate free pelvic fluid. IMPRESSION: 1. Normal sonographic appearance of the uterus. 2. The right ovary is normal. 3. Complex multi septated cystic structure associated with the left ovary measuring 4.4 x 3.0 x 3.3 cm. This is most likely a complex cyst. There is also a moderate amount of free pelvic fluid and it is possible that this cyst partially ruptured. Follow-up pelvic ultrasound in 3-4 months is suggested. Electronically Signed   By: Rudie Meyer M.D.   On: 05/14/2018 16:53   US Pelvis Complete  Result Date: 05/14/2018 CLINICAL DATA:  One day history of pelvic pain. EXAM: TRANSABDOMINAL AND TRANSVAGINAL ULTRASOUND OF PELVIS DOPPLER ULTRASOUND OF OVARIES TECHNIQUE: Both transabdominal and transvaginal ultrasound examinations of the pelvis were performed. Transabdominal technique was performed for global imaging of the pelvis including uterus, ovaries, adnexal regions, and pelvic cul-de-sac. It was necessary to proceed with endovaginal exam following the transabdominal exam to visualize the ovaries and endometrium. Color and duplex Doppler ultrasound was utilized to evaluate blood flow to the ovaries. COMPARISON:  None. FINDINGS: Uterus Measurements: 9.1 x 5.5 x 5.0 cm = volume: 130.7 mL. No myometrial abnormalities. Endometrium Thickness: 8.0.  No focal abnormality visualized. Right ovary Measurements: 2.4 x 1.0 x 1.6 cm = volume: 2.1 mL. Normal appearance/no adnexal mass. Left ovary Measurements: 5.2 x  3.6 x 4.3 cm = volume: 42.3 mL. There is a complex multi septated cystic lesion measuring 4.4 x 3.0 x 3.3 cm. Pulsed Doppler evaluation of both ovaries demonstrates normal low-resistance arterial and venous waveforms. Other findings Moderate free pelvic fluid. IMPRESSION: 1. Normal sonographic appearance of the uterus. 2. The right ovary is normal. 3. Complex multi septated cystic  structure associated with the left ovary measuring 4.4 x 3.0 x 3.3 cm. This is most likely a complex cyst. There is also a moderate amount of free pelvic fluid and it is possible that this cyst partially ruptured. Follow-up pelvic ultrasound in 3-4 months is suggested. Electronically Signed   By: Rudie Meyer M.D.   On: 05/14/2018 16:53   [place] Art/ven Flow Abd Pelv Doppler  Result Date: 05/14/2018 CLINICAL DATA:  One day history of pelvic pain. EXAM: TRANSABDOMINAL AND TRANSVAGINAL ULTRASOUND OF PELVIS DOPPLER ULTRASOUND OF OVARIES TECHNIQUE: Both transabdominal and transvaginal ultrasound examinations of the pelvis were performed. Transabdominal technique was performed for global imaging of the pelvis including uterus, ovaries, adnexal regions, and pelvic cul-de-sac. It was necessary to proceed with endovaginal exam following the transabdominal exam to visualize the ovaries and endometrium. Color and duplex Doppler ultrasound was utilized to evaluate blood flow to the ovaries. COMPARISON:  None. FINDINGS: Uterus Measurements: 9.1 x 5.5 x 5.0 cm = volume: 130.7 mL. No myometrial abnormalities. Endometrium Thickness: 8.0.  No focal abnormality visualized. Right ovary Measurements: 2.4 x 1.0 x 1.6 cm = volume: 2.1 mL. Normal appearance/no adnexal mass. Left ovary Measurements: 5.2 x 3.6 x 4.3 cm = volume: 42.3 mL. There is a complex multi septated cystic lesion measuring 4.4 x 3.0 x 3.3 cm. Pulsed Doppler evaluation of both ovaries demonstrates normal low-resistance arterial and venous waveforms. Other findings Moderate free pelvic fluid. IMPRESSION: 1. Normal sonographic appearance of the uterus. 2. The right ovary is normal. 3. Complex multi septated cystic structure associated with the left ovary measuring 4.4 x 3.0 x 3.3 cm. This is most likely a complex cyst. There is also a moderate amount of free pelvic fluid and it is possible that this cyst partially ruptured. Follow-up pelvic ultrasound in 3-4 months is  suggested. Electronically Signed   By: Rudie Meyer M.D.   On: 05/14/2018 16:53    EKG None  Radiology US Transvaginal Non-ob  Result Date: 05/14/2018 CLINICAL DATA:  One day history of pelvic pain. EXAM: TRANSABDOMINAL AND TRANSVAGINAL ULTRASOUND OF PELVIS DOPPLER ULTRASOUND OF OVARIES TECHNIQUE: Both transabdominal and transvaginal ultrasound examinations of the pelvis were performed. Transabdominal technique was performed for global imaging of the pelvis including uterus, ovaries, adnexal regions, and pelvic cul-de-sac. It was necessary to proceed with endovaginal exam following the transabdominal exam to visualize the ovaries and endometrium. Color and duplex Doppler ultrasound was utilized to evaluate blood flow to the ovaries. COMPARISON:  None. FINDINGS: Uterus Measurements: 9.1 x 5.5 x 5.0 cm = volume: 130.7 mL. No myometrial abnormalities. Endometrium Thickness: 8.0.  No focal abnormality visualized. Right ovary Measurements: 2.4 x 1.0 x 1.6 cm = volume: 2.1 mL. Normal appearance/no adnexal mass. Left ovary Measurements: 5.2 x 3.6 x 4.3 cm = volume: 42.3 mL. There is a complex multi septated cystic lesion measuring 4.4 x 3.0 x 3.3 cm. Pulsed Doppler evaluation of both ovaries demonstrates normal low-resistance arterial and venous waveforms. Other findings Moderate free pelvic fluid. IMPRESSION: 1. Normal sonographic appearance of the uterus. 2. The right ovary is normal. 3. Complex multi septated cystic structure associated with the left  ovary measuring 4.4 x 3.0 x 3.3 cm. This is most likely a complex cyst. There is also a moderate amount of free pelvic fluid and it is possible that this cyst partially ruptured. Follow-up pelvic ultrasound in 3-4 months is suggested. Electronically Signed   By: Rudie Meyer M.D.   On: 05/14/2018 16:53   US Pelvis Complete  Result Date: 05/14/2018 CLINICAL DATA:  One day history of pelvic pain. EXAM: TRANSABDOMINAL AND TRANSVAGINAL ULTRASOUND OF PELVIS  DOPPLER ULTRASOUND OF OVARIES TECHNIQUE: Both transabdominal and transvaginal ultrasound examinations of the pelvis were performed. Transabdominal technique was performed for global imaging of the pelvis including uterus, ovaries, adnexal regions, and pelvic cul-de-sac. It was necessary to proceed with endovaginal exam following the transabdominal exam to visualize the ovaries and endometrium. Color and duplex Doppler ultrasound was utilized to evaluate blood flow to the ovaries. COMPARISON:  None. FINDINGS: Uterus Measurements: 9.1 x 5.5 x 5.0 cm = volume: 130.7 mL. No myometrial abnormalities. Endometrium Thickness: 8.0.  No focal abnormality visualized. Right ovary Measurements: 2.4 x 1.0 x 1.6 cm = volume: 2.1 mL. Normal appearance/no adnexal mass. Left ovary Measurements: 5.2 x 3.6 x 4.3 cm = volume: 42.3 mL. There is a complex multi septated cystic lesion measuring 4.4 x 3.0 x 3.3 cm. Pulsed Doppler evaluation of both ovaries demonstrates normal low-resistance arterial and venous waveforms. Other findings Moderate free pelvic fluid. IMPRESSION: 1. Normal sonographic appearance of the uterus. 2. The right ovary is normal. 3. Complex multi septated cystic structure associated with the left ovary measuring 4.4 x 3.0 x 3.3 cm. This is most likely a complex cyst. There is also a moderate amount of free pelvic fluid and it is possible that this cyst partially ruptured. Follow-up pelvic ultrasound in 3-4 months is suggested. Electronically Signed   By: Rudie Meyer M.D.   On: 05/14/2018 16:53   [place] Art/ven Flow Abd Pelv Doppler  Result Date: 05/14/2018 CLINICAL DATA:  One day history of pelvic pain. EXAM: TRANSABDOMINAL AND TRANSVAGINAL ULTRASOUND OF PELVIS DOPPLER ULTRASOUND OF OVARIES TECHNIQUE: Both transabdominal and transvaginal ultrasound examinations of the pelvis were performed. Transabdominal technique was performed for global imaging of the pelvis including uterus, ovaries, adnexal regions, and pelvic  cul-de-sac. It was necessary to proceed with endovaginal exam following the transabdominal exam to visualize the ovaries and endometrium. Color and duplex Doppler ultrasound was utilized to evaluate blood flow to the ovaries. COMPARISON:  None. FINDINGS: Uterus Measurements: 9.1 x 5.5 x 5.0 cm = volume: 130.7 mL. No myometrial abnormalities. Endometrium Thickness: 8.0.  No focal abnormality visualized. Right ovary Measurements: 2.4 x 1.0 x 1.6 cm = volume: 2.1 mL. Normal appearance/no adnexal mass. Left ovary Measurements: 5.2 x 3.6 x 4.3 cm = volume: 42.3 mL. There is a complex multi septated cystic lesion measuring 4.4 x 3.0 x 3.3 cm. Pulsed Doppler evaluation of both ovaries demonstrates normal low-resistance arterial and venous waveforms. Other findings Moderate free pelvic fluid. IMPRESSION: 1. Normal sonographic appearance of the uterus. 2. The right ovary is normal. 3. Complex multi septated cystic structure associated with the left ovary measuring 4.4 x 3.0 x 3.3 cm. This is most likely a complex cyst. There is also a moderate amount of free pelvic fluid and it is possible that this cyst partially ruptured. Follow-up pelvic ultrasound in 3-4 months is suggested. Electronically Signed   By: Rudie Meyer M.D.   On: 05/14/2018 16:53    Procedures Procedures (including critical care time)  Medications Ordered in ED Medications  sodium chloride 0.9 % bolus 1,000 mL (has no administration in time range)  morphine 4 MG/ML injection 4 mg (has no administration in time range)  ondansetron (ZOFRAN) injection 4 mg (has no administration in time range)     Initial Impression / Assessment and Plan / ED Course  I have reviewed the triage vital signs and the nursing notes.  Pertinent labs & imaging results that were available during my care of the patient were reviewed by me and considered in my medical decision making (see chart for details).  Iv ns. Labs.   Reviewed nursing notes and prior charts  for additional history.   Morphine iv. zofran iv.   Given acutely worsening pelvic pain this AM, will get u/s r/o torsion.   Recheck pain improved.   U/s reviewed - cyst sl smaller, no torsion. ?whether periodic leaking for fluid from ruptured cyst. Pt afebrile. No cmt or purulent discharge. Wbc normal. Prior gc/chlam neg.   Recheck abd soft nt. Pt improved.   Pt currently appears stable for d/c.   Recommend follow up ob/gyn Monday.   Return precautions provided.     Final Clinical Impressions(s) / ED Diagnoses   Final diagnoses:  None    ED Discharge Orders    None       Cathren Laine, MD 05/16/18 1511

## 2018-05-16 NOTE — Discharge Instructions (Addendum)
It was our pleasure to provide your ER care today - we hope that you feel better.  Take acetaminophen and/or naprosyn as need for pain.   Follow up with ob/gyn doctor this Monday.  Return to ER if worse, new symptoms, fevers, worsening or intractable pain, persistent vomiting, other concern.   You were given pain medication in the ER - no driving for the next 4 hours.

## 2018-05-18 LAB — GC/CHLAMYDIA PROBE AMP (~~LOC~~) NOT AT ARMC
Chlamydia: NEGATIVE
Neisseria Gonorrhea: NEGATIVE

## 2018-07-05 ENCOUNTER — Encounter (HOSPITAL_COMMUNITY): Payer: Self-pay | Admitting: Emergency Medicine

## 2018-07-05 ENCOUNTER — Other Ambulatory Visit: Payer: Self-pay

## 2018-07-05 ENCOUNTER — Emergency Department (HOSPITAL_COMMUNITY): Payer: PRIVATE HEALTH INSURANCE

## 2018-07-05 ENCOUNTER — Emergency Department (HOSPITAL_COMMUNITY)
Admission: EM | Admit: 2018-07-05 | Discharge: 2018-07-05 | Disposition: A | Discharge status: Home / Self Care | Payer: PRIVATE HEALTH INSURANCE | Attending: Emergency Medicine | Admitting: Emergency Medicine

## 2018-07-05 DIAGNOSIS — R1010 Upper abdominal pain, unspecified: Secondary | ICD-10-CM

## 2018-07-05 DIAGNOSIS — R0602 Shortness of breath: Secondary | ICD-10-CM | POA: Diagnosis not present

## 2018-07-05 DIAGNOSIS — R1011 Right upper quadrant pain: Secondary | ICD-10-CM | POA: Diagnosis not present

## 2018-07-05 LAB — COMPREHENSIVE METABOLIC PANEL
ALT: 17 U/L (ref 0–44)
AST: 16 U/L (ref 15–41)
Albumin: 4.6 g/dL (ref 3.5–5.0)
Alkaline Phosphatase: 46 U/L (ref 38–126)
Anion gap: 8 (ref 5–15)
BUN: 9 mg/dL (ref 6–20)
CO2: 25 mmol/L (ref 22–32)
Calcium: 9.9 mg/dL (ref 8.9–10.3)
Chloride: 104 mmol/L (ref 98–111)
Creatinine, Ser: 0.76 mg/dL (ref 0.44–1.00)
GFR calc Af Amer: >60 mL/min (ref >60)
GFR calc non Af Amer: >60 mL/min (ref >60)
Glucose, Bld: 77 mg/dL (ref 70–99)
Potassium: 3.4 mmol/L — ABNORMAL LOW (ref 3.5–5.1)
Sodium: 137 mmol/L (ref 135–145)
Total Bilirubin: 0.6 mg/dL (ref 0.3–1.2)
Total Protein: 7.9 g/dL (ref 6.5–8.1)

## 2018-07-05 LAB — CBC WITH DIFFERENTIAL/PLATELET
Abs Immature Granulocytes: 0.01 10*3/uL (ref 0.00–0.07)
Basophils Absolute: 0 10*3/uL (ref 0.0–0.1)
Basophils Relative: 0 %
Eosinophils Absolute: 0.1 10*3/uL (ref 0.0–0.5)
Eosinophils Relative: 1 %
HCT: 38.2 % (ref 36.0–46.0)
Hemoglobin: 13 g/dL (ref 12.0–15.0)
Immature Granulocytes: 0 %
Lymphocytes Relative: 28 %
Lymphs Abs: 1.9 10*3/uL (ref 0.7–4.0)
MCH: 31.9 pg (ref 26.0–34.0)
MCHC: 34 g/dL (ref 30.0–36.0)
MCV: 93.6 fL (ref 80.0–100.0)
Monocytes Absolute: 0.4 10*3/uL (ref 0.1–1.0)
Monocytes Relative: 5 %
Neutro Abs: 4.3 10*3/uL (ref 1.7–7.7)
Neutrophils Relative %: 66 %
Platelets: 248 10*3/uL (ref 150–400)
RBC: 4.08 MIL/uL (ref 3.87–5.11)
RDW: 11.6 % (ref 11.5–15.5)
WBC: 6.6 10*3/uL (ref 4.0–10.5)
nRBC: 0 % (ref 0.0–0.2)

## 2018-07-05 LAB — LIPASE, BLOOD: Lipase: 33 U/L (ref 11–51)

## 2018-07-05 LAB — URINALYSIS, ROUTINE W REFLEX MICROSCOPIC
Bilirubin Urine: NEGATIVE
Glucose, UA: NEGATIVE mg/dL
Hgb urine dipstick: NEGATIVE
Ketones, ur: NEGATIVE mg/dL
Leukocytes,Ua: NEGATIVE
Nitrite: NEGATIVE
Protein, ur: NEGATIVE mg/dL
Specific Gravity, Urine: 1.013 (ref 1.005–1.030)
pH: 9 — ABNORMAL HIGH (ref 5.0–8.0)

## 2018-07-05 LAB — POC URINE PREG, ED: Preg Test, Ur: NEGATIVE

## 2018-07-05 LAB — TROPONIN I: Troponin I: <0.03 ng/mL

## 2018-07-05 MED ORDER — FAMOTIDINE 20 MG IN NS 100 ML IVPB
20.0000 mg | Freq: Once | INTRAVENOUS | Status: AC
  Administered 2018-07-05: 20 mg via INTRAVENOUS
  Filled 2018-07-05: qty 100

## 2018-07-05 MED ORDER — LIDOCAINE VISCOUS HCL 2 % MT SOLN
15.0000 mL | Freq: Once | OROMUCOSAL | Status: AC
  Administered 2018-07-05: 15 mL via ORAL
  Filled 2018-07-05: qty 15

## 2018-07-05 MED ORDER — ALUM & MAG HYDROXIDE-SIMETH 200-200-20 MG/5ML PO SUSP
30.0000 mL | Freq: Once | ORAL | Status: AC
  Administered 2018-07-05: 30 mL via ORAL
  Filled 2018-07-05: qty 30

## 2018-07-05 MED ORDER — SODIUM CHLORIDE 0.9 % IV BOLUS
1000.0000 mL | Freq: Once | INTRAVENOUS | Status: AC
  Administered 2018-07-05: 21:00:00 1000 mL via INTRAVENOUS

## 2018-07-05 MED ORDER — SUCRALFATE 1 G PO TABS: 1.0000 g | ORAL_TABLET | Freq: Three times a day (TID) | ORAL | 0 refills | Status: DC

## 2018-07-05 NOTE — ED Provider Notes (Signed)
I assumed care of [employer] at shift change, please see her note for full H&P.  Briefly patient is here for evaluation of epigastric pain after eating for 3 days.  She took an antacid approximately 3 hours prior to arrival without significant relief.   Physical Exam  BP 128/78 (BP Location: Right Arm)   Pulse 85   Temp 98.7 F (37.1 C) (Oral)   Resp 16   Ht 5\' 7"  (1.702 m)   Wt 61.2 kg   LMP 06/29/2018 (Exact Date)   SpO2 100%   BMI 21.14 kg/m   Physical Exam Vitals signs and nursing note reviewed.  Constitutional:      General: She is not in acute distress. HENT:     Head: Normocephalic.  Neurological:     Mental Status: She is alert and oriented to person, place, and time.  Psychiatric:        Mood and Affect: Mood normal.        Behavior: Behavior normal.     ED Course/Procedures     Procedures  MDM  Plan is to follow-up on labs, chest x-ray, abdominal ultrasound. If those are negative then we will treat as gastritis/heartburn.  Labs, chest x-ray, abdominal ultrasound all obtained and reviewed, do not see any significant electrolyte or hematologic derangements.  Abdominal ultrasound is reassuring.  Chest x-ray is also reassuring.  Per plan established with previous team she is given prescriptions for Carafate, and PPI.  She is given GI follow-up in case her symptoms do not improve.  We discussed conservative care.  Return precautions were discussed with patient who states their understanding.  At the time of discharge patient denied any unaddressed complaints or concerns.  Patient is agreeable for discharge home.      Cristina Gong, Cordelia Poche 07/05/18 2229    Geoffery Lyons, MD 07/05/18 2232

## 2018-07-05 NOTE — ED Notes (Signed)
Patient transported to Ultrasound 

## 2018-07-05 NOTE — Discharge Instructions (Addendum)
Please do not take any ibuprofen or drink alcohol. Please elevate the head of your bed approximately 30 degrees if possible.  You also need to stay sitting up for 2 to 3 hours after you eat.  I have given you the information for the stomach doctor.  If your symptoms do not start to improve in 1 week please follow-up with them.  Please get Prilosec, which is available over-the-counter, and start taking that.

## 2018-07-05 NOTE — ED Provider Notes (Signed)
[facility] EMERGENCY DEPARTMENT Provider Note   CSN: [id number] Arrival date & time: 07/05/18  1754    History   Chief Complaint Chief Complaint  Patient presents with  . Shortness of Breath    HPI    Tracie Howard is a 23 y.o. otherwise healthy female with a PSHx of appendectomy, who presents to the ED with complaints of shortness of breath after eating and epigastric discomfort for the last 3-4 days.  Patient states that after she eats, she feels short of breath and she gets a discomfort in her neck and nose like she cannot breathe.  She reports having heartburn and some epigastric discomfort that feels like a fullness feeling especially after eating.  Symptoms seem to worsen after eating.  She has tried an over-the-counter antacid with no relief.  She denies having any fevers, chills, URI symptoms such as sore throat or runny nose, cough, chest pain, leg swelling, recent travel/surgery/immobilization, estrogen use, personal or family history of DVT/PE, nausea, vomiting, diarrhea, constipation, dysuria, hematuria, numbness, tingling, focal weakness, or any other complaints at this time.  She is non-smoker with no known family history of cardiac disease.  The history is provided by the patient and medical records. No language interpreter was used.    Past Medical History:  Diagnosis Date  . Medical history non-contributory     There are no active problems to display for this patient.   Past Surgical History:  Procedure Laterality Date  . APPENDECTOMY    . THERAPEUTIC ABORTION       OB History    Gravida  2   Para  1   Term  1   Preterm      AB  1   Living  1     SAB      TAB  1   Ectopic      Multiple      Live Births               Home Medications    Prior to Admission medications   Medication Sig Start Date End Date Taking? Authorizing Provider  azithromycin (ZITHROMAX Z-PAK) 250 MG tablet Take the first 2 tablets today  and then one tablet PO daily. Patient not taking: Reported on 05/14/2018 03/03/18   Janne Napoleon, NP  cyclobenzaprine (FLEXERIL) 10 MG tablet Take 0.5 tablets (5 mg total) by mouth at bedtime. Patient not taking: Reported on 05/14/2018 05/11/18   Dahlia Byes A, NP  fluticasone (FLONASE) 50 MCG/ACT nasal spray Place 1 spray into both nostrils daily. Patient not taking: Reported on 05/14/2018 12/28/17   Linus Mako B, NP  ibuprofen (ADVIL,MOTRIN) 800 MG tablet Take 1 tablet (800 mg total) by mouth 3 (three) times daily. Patient not taking: Reported on 05/14/2018 12/28/17   Georgetta Haber, NP  megestrol (MEGACE) 40 MG tablet Take 1 tablet (40 mg total) by mouth 2 (two) times daily as needed. Patient not taking: Reported on 05/14/2018 02/17/18   Judeth Horn, NP  naproxen (NAPROSYN) 500 MG tablet Take 1 tablet (500 mg total) by mouth 2 (two) times daily. Patient not taking: Reported on 07/05/2018 05/14/18   Liberty Handy, PA-C    Family History Family History  Problem Relation Age of Onset  . Hypertension Mother   . Diabetes Father     Social History Social History   Tobacco Use  . Smoking status: Never Smoker  . Smokeless tobacco: Never Used  Substance Use Topics  .  Alcohol use: Never    Frequency: Never  . Drug use: Never     Allergies   Zosyn [piperacillin sod-tazobactam so]   Review of Systems Review of Systems  Constitutional: Negative for chills and fever.  HENT: Negative for rhinorrhea and sore throat.   Respiratory: Positive for shortness of breath. Negative for cough.   Cardiovascular: Negative for chest pain and leg swelling.  Gastrointestinal: Positive for abdominal pain. Negative for constipation, diarrhea, nausea and vomiting.  Genitourinary: Negative for dysuria and hematuria.  Musculoskeletal: Negative for arthralgias and myalgias.  Skin: Negative for color change.  Allergic/Immunologic: Negative for immunocompromised state.  Neurological: Negative for  weakness and numbness.  Psychiatric/Behavioral: Negative for confusion.   All other systems reviewed and are negative for acute change except as noted in the HPI.    Physical Exam Updated Vital Signs BP 128/78 (BP Location: Right Arm)   Pulse 85   Temp 98.7 F (37.1 C) (Oral)   Resp 16   Ht  (1.702 m)   Wt 61.2 kg   LMP 06/29/2018 (Exact Date)   SpO2 100%   BMI 21.14 kg/m   Physical Exam Vitals signs and nursing note reviewed.  Constitutional:      General: She is not in acute distress.    Appearance: Normal appearance. She is well-developed. She is not toxic-appearing.     Comments: Afebrile, nontoxic, NAD  HENT:     Head: Normocephalic and atraumatic.     Nose: Nose normal.     Mouth/Throat:     Mouth: Mucous membranes are moist.     Pharynx: Oropharynx is clear. Uvula midline. No pharyngeal swelling, oropharyngeal exudate, posterior oropharyngeal erythema or uvula swelling.     Tonsils: No tonsillar exudate or tonsillar abscesses.     Comments: Nose clear. Oropharynx clear and moist, without uvular swelling or deviation, no trismus or drooling, no tonsillar swelling or erythema, no exudates.   Eyes:     General:        Right eye: No discharge.        Left eye: No discharge.     Conjunctiva/sclera: Conjunctivae normal.  Neck:     Musculoskeletal: Normal range of motion and neck supple.  Cardiovascular:     Rate and Rhythm: Normal rate and regular rhythm.     Pulses: Normal pulses.     Heart sounds: Normal heart sounds, S1 normal and S2 normal. No murmur. No friction rub. No gallop.      Comments: RRR, nl s1/s2, no m/r/g, distal pulses intact, no pedal edema  Pulmonary:     Effort: Pulmonary effort is normal. No respiratory distress.     Breath sounds: Normal breath sounds. No decreased breath sounds, wheezing, rhonchi or rales.     Comments: CTAB in all lung fields, no w/r/r, no hypoxia or increased WOB, speaking in full sentences, SpO2 100% on RA  Abdominal:      General: Bowel sounds are normal. There is no distension.     Palpations: Abdomen is soft. Abdomen is not rigid.     Tenderness: There is abdominal tenderness in the right upper quadrant, epigastric area and left upper quadrant. There is no right CVA tenderness, left CVA tenderness, guarding or rebound. Positive signs include Murphy's sign. Negative signs include McBurney's sign.     Comments: Soft, nondistended, +BS throughout, with moderate upper abd TTP diffusely across the upper abd but most focally in the RUQ, no r/g/r, +murphy's, neg mcburney's, no CVA TTP  Musculoskeletal: Normal range of motion.     Comments: MAE x4 Strength and sensation grossly intact in all extremities Distal pulses intact No pedal edema, neg homan's bilaterally   Skin:    General: Skin is warm and dry.     Findings: No rash.  Neurological:     Mental Status: She is alert and oriented to person, place, and time.     Sensory: Sensation is intact. No sensory deficit.     Motor: Motor function is intact.  Psychiatric:        Mood and Affect: Mood and affect normal.        Behavior: Behavior normal.      ED Treatments / Results  Labs (all labs ordered are listed, but only abnormal results are displayed) Labs Reviewed  URINALYSIS, ROUTINE W REFLEX MICROSCOPIC - Abnormal; Notable for the following components:      Result Value   APPearance CLOUDY (*)    pH 9.0 (*)    All other components within normal limits  CBC WITH DIFFERENTIAL/PLATELET  COMPREHENSIVE METABOLIC PANEL  LIPASE, BLOOD  TROPONIN I  POC URINE PREG, ED    EKG EKG Interpretation  Date/Time:  Sunday July 05 2018 20:16:12 EDT Ventricular Rate:  65 PR Interval:    QRS Duration: 75 QT Interval:  369 QTC Calculation: 384 R Axis:   70 Text Interpretation:  Sinus rhythm Normal ECG Confirmed by DeLo, Douglas ([id number]) on 07/05/2018 8:28:09 PM   Radiology No results found.  Procedures Procedures (including critical care time)   Medications Ordered in ED Medications  alum & mag hydroxide-simeth (MAALOX/MYLANTA) 200-200-20 MG/5ML suspension 30 mL (has no administration in time range)    And  lidocaine (XYLOCAINE) 2 % viscous mouth solution 15 mL (has no administration in time range)  famotidine (PEPCID) IVPB 20 mg in NS 100 mL IVPB (has no administration in time range)  sodium chloride 0.9 % bolus 1,000 mL (1,000 mLs Intravenous New Bag/Given 07/05/18 2031)     Initial Impression / Assessment and Plan / ED Course  I have reviewed the triage vital signs and the nursing notes.  Pertinent labs & imaging results that were available during my care of the patient were reviewed by me and considered in my medical decision making (see chart for details).        23  y.o. female here with upper abdominal discomfort and shortness of breath.  Patient states that when she eats, afterwards she feels like she cannot breathe sometimes, and has a full feeling in her abdomen and some epigastric discomfort which she describes as heartburn.  On exam, clear lung exam, no tachycardia or hypoxia, no pedal edema, PERC negative, with diffuse upper abdominal tenderness most focally in the RUQ, positive Murphy's, no lower abdominal tenderness, non-peritoneal.  Throat and nose clear. DDx includes cholecystitis vs indigestion vs atypical chest pain, etc. Will get labs, EKG, CXR, and abd U/S. Will give GI cocktail and pepcid, fluids, and reassess.   8:37 PM Upreg neg. U/A unremarkable. EKG unremarkable. Remainder of work up pending. Patient care to be resumed by Tracie SafeElizabeth Hammond, PA-C at shift change sign-out. Patient history has been discussed with provider resuming care. Please see their notes for further documentation of pending results and dispo/care. Pt stable at sign-out and updated on transfer of care.    Final Clinical Impressions(s) / ED Diagnoses   Final diagnoses:  Upper abdominal pain  SOB (shortness of breath)    ED Discharge  Orders  [address] [place], [place] 07/05/18 2037    Geoffery Lyons, MD 07/05/18 2225

## 2018-07-05 NOTE — ED Triage Notes (Signed)
Pt reports Heartburn and SHOB while eating x 3 days. Pt denies N/V/D and abdominal pain. Pt took antacid about 3 hours ago.

## 2018-11-14 ENCOUNTER — Ambulatory Visit (HOSPITAL_COMMUNITY)
Admission: EM | Admit: 2018-11-14 | Discharge: 2018-11-14 | Disposition: A | Discharge status: Home / Self Care | Payer: HRSA Program | Attending: Internal Medicine | Admitting: Internal Medicine

## 2018-11-14 ENCOUNTER — Encounter (HOSPITAL_COMMUNITY): Payer: Self-pay

## 2018-11-14 ENCOUNTER — Other Ambulatory Visit: Payer: Self-pay

## 2018-11-14 DIAGNOSIS — B9789 Other viral agents as the cause of diseases classified elsewhere: Secondary | ICD-10-CM | POA: Insufficient documentation

## 2018-11-14 DIAGNOSIS — R05 Cough: Secondary | ICD-10-CM

## 2018-11-14 DIAGNOSIS — J069 Acute upper respiratory infection, unspecified: Secondary | ICD-10-CM | POA: Diagnosis not present

## 2018-11-14 DIAGNOSIS — Z20822 Contact with and (suspected) exposure to covid-19: Secondary | ICD-10-CM

## 2018-11-14 DIAGNOSIS — Z20828 Contact with and (suspected) exposure to other viral communicable diseases: Secondary | ICD-10-CM | POA: Diagnosis not present

## 2018-11-14 MED ORDER — FLUTICASONE PROPIONATE 50 MCG/ACT NA SUSP: 1.0000 | Freq: Every day | NASAL | 0 refills | Status: DC

## 2018-11-14 MED ORDER — CETIRIZINE HCL 10 MG PO CAPS: 10.0000 mg | ORAL_CAPSULE | Freq: Every day | ORAL | 0 refills | Status: DC

## 2018-11-14 MED ORDER — BENZONATATE 200 MG PO CAPS: 200.0000 mg | ORAL_CAPSULE | Freq: Three times a day (TID) | ORAL | 0 refills | Status: AC | PRN

## 2018-11-14 MED ORDER — IBUPROFEN 600 MG PO TABS: 600.0000 mg | ORAL_TABLET | Freq: Three times a day (TID) | ORAL | 0 refills | Status: DC | PRN

## 2018-11-14 NOTE — Discharge Instructions (Addendum)
COVID swab is pending Please stay home until results return  Begin daily cetirizine to help with congestion and drainage Flonase nasal spray to help with nasal congestion Tessalon as needed for cough every 8 hours Ibuprofen and Tylenol for chest and back discomfort Rest and drink plenty of fluids  Please follow-up if symptoms not resolving, worsening developing increased pain, shortness of breath, difficulty breathing or fevers     Person Under Monitoring Name: Tracie Howard  Location: [address] [place] [address]   Infection Prevention Recommendations for Individuals Confirmed to have, or Being Evaluated for, 2019 Novel Coronavirus (COVID-19) Infection Who Receive Care at Home  Individuals who are confirmed to have, or are being evaluated for, COVID-19 should follow the prevention steps below until a healthcare provider or local or state health department says they can return to normal activities.  Stay home except to get medical care You should restrict activities outside your home, except for getting medical care. Do not go to work, school, or public areas, and do not use public transportation or taxis.  Call ahead before visiting your doctor Before your medical appointment, call the healthcare provider and tell them that you have, or are being evaluated for, COVID-19 infection. This will help the healthcare providers office take steps to keep other people from getting infected. Ask your healthcare provider to call the local or state health department.  Monitor your symptoms Seek prompt medical attention if your illness is worsening (e.g., difficulty breathing). Before going to your medical appointment, call the healthcare provider and tell them that you have, or are being evaluated for, COVID-19 infection. Ask your healthcare provider to call the local or state health department.  Wear a facemask You should wear a facemask that covers your nose  and mouth when you are in the same room with other people and when you visit a healthcare provider. People who live with or visit you should also wear a facemask while they are in the same room with you.  Separate yourself from other people in your home As much as possible, you should stay in a different room from other people in your home. Also, you should use a separate bathroom, if available.  Avoid sharing household items You should not share dishes, drinking glasses, cups, eating utensils, towels, bedding, or other items with other people in your home. After using these items, you should wash them thoroughly with soap and water.  Cover your coughs and sneezes Cover your mouth and nose with a tissue when you cough or sneeze, or you can cough or sneeze into your sleeve. Throw used tissues in a lined trash can, and immediately wash your hands with soap and water for at least 20 seconds or use an alcohol-based hand rub.  Wash your [facility] your hands often and thoroughly with soap and water for at least 20 seconds. You can use an alcohol-based hand sanitizer if soap and water are not available and if your hands are not visibly dirty. Avoid touching your eyes, nose, and mouth with unwashed hands.   Prevention Steps for Caregivers and Household Members of Individuals Confirmed to have, or Being Evaluated for, COVID-19 Infection Being Cared for in the Home  If you live with, or provide care at home for, a person confirmed to have, or being evaluated for, COVID-19 infection please follow these guidelines to prevent infection:  Follow healthcare providers instructions Make sure that you understand and can help the patient follow any  healthcare provider instructions for all care.  Provide for the patients basic needs You should help the patient with basic needs in the home and provide support for getting groceries, prescriptions, and other personal needs.  Monitor the patients  symptoms If they are getting sicker, call his or her medical provider and tell them that the patient has, or is being evaluated for, COVID-19 infection. This will help the healthcare providers office take steps to keep other people from getting infected. Ask the healthcare provider to call the local or state health department.  Limit the number of people who have contact with the patient If possible, have only one caregiver for the patient. Other household members should stay in another home or place of residence. If this is not possible, they should stay in another room, or be separated from the patient as much as possible. Use a separate bathroom, if available. Restrict visitors who do not have an essential need to be in the home.  Keep older adults, very young children, and other sick people away from the patient Keep older adults, very young children, and those who have compromised immune systems or chronic health conditions away from the patient. This includes people with chronic heart, lung, or kidney conditions, diabetes, and cancer.  Ensure good ventilation Make sure that shared spaces in the home have good air flow, such as from an air conditioner or an opened window, weather permitting.  Wash your hands often Wash your hands often and thoroughly with soap and water for at least 20 seconds. You can use an alcohol based hand sanitizer if soap and water are not available and if your hands are not visibly dirty. Avoid touching your eyes, nose, and mouth with unwashed hands. Use disposable paper towels to dry your hands. If not available, use dedicated cloth towels and replace them when they become wet.  Wear a facemask and gloves Wear a disposable facemask at all times in the room and gloves when you touch or have contact with the patients blood, body fluids, and/or secretions or excretions, such as sweat, saliva, sputum, nasal mucus, vomit, urine, or feces.  Ensure the mask fits over  your nose and mouth tightly, and do not touch it during use. Throw out disposable facemasks and gloves after using them. Do not reuse. Wash your hands immediately after removing your facemask and gloves. If your personal clothing becomes contaminated, carefully remove clothing and launder. Wash your hands after handling contaminated clothing. Place all used disposable facemasks, gloves, and other waste in a lined container before disposing them with other household waste. Remove gloves and wash your hands immediately after handling these items.  Do not share dishes, glasses, or other household items with the patient Avoid sharing household items. You should not share dishes, drinking glasses, cups, eating utensils, towels, bedding, or other items with a patient who is confirmed to have, or being evaluated for, COVID-19 infection. After the person uses these items, you should wash them thoroughly with soap and water.  Wash laundry thoroughly Immediately remove and wash clothes or bedding that have blood, body fluids, and/or secretions or excretions, such as sweat, saliva, sputum, nasal mucus, vomit, urine, or feces, on them. Wear gloves when handling laundry from the patient. Read and follow directions on labels of laundry or clothing items and detergent. In general, wash and dry with the warmest temperatures recommended on the label.  Clean all areas the individual has used often Clean all touchable surfaces, such as counters, tabletops,  doorknobs, bathroom fixtures, toilets, phones, keyboards, tablets, and bedside tables, every day. Also, clean any surfaces that may have blood, body fluids, and/or secretions or excretions on them. Wear gloves when cleaning surfaces the patient has come in contact with. Use a diluted bleach solution (e.g., dilute bleach with 1 part bleach and 10 parts water) or a household disinfectant with a label that says EPA-registered for coronaviruses. To make a bleach  solution at home, add 1 tablespoon of bleach to 1 quart (4 cups) of water. For a larger supply, add  cup of bleach to 1 gallon (16 cups) of water. Read labels of cleaning products and follow recommendations provided on product labels. Labels contain instructions for safe and effective use of the cleaning product including precautions you should take when applying the product, such as wearing gloves or eye protection and making sure you have good ventilation during use of the product. Remove gloves and wash hands immediately after cleaning.  Monitor yourself for signs and symptoms of illness Caregivers and household members are considered close contacts, should monitor their health, and will be asked to limit movement outside of the home to the extent possible. Follow the monitoring steps for close contacts listed on the symptom monitoring form.   ? If you have additional questions, contact your local health department or call the epidemiologist on call at [phone] (available 24/7). ? This guidance is subject to change. For the most up-to-date guidance from [facility], please refer to their website: TripMet[employer].huhttps://www.cdc.gov/coronavirus/2019-ncov/hcp/guidance-prevent-spread.html

## 2018-11-14 NOTE — ED Triage Notes (Signed)
Patient states she was recently exposed to someone who tested positive for covid-19. Pt states she has been having some coughing and nasal drainage as her symptoms that started 3 days ago.

## 2018-11-15 LAB — NOVEL CORONAVIRUS, NAA (HOSP ORDER, SEND-OUT TO REF LAB; TAT 18-24 HRS): SARS-CoV-2, NAA: NOT DETECTED

## 2018-11-15 NOTE — ED Provider Notes (Signed)
[facility]    CSN: [id number] Arrival date & time: 11/14/18  1657      History   Chief Complaint Chief Complaint  Patient presents with  . Exposure to Covid 19  . Cough    HPI Tracie Howard is a 23 y.o. female no significant past medical history presenting today for evaluation of COVID exposure and URI symptoms.  Patient has had cough, drainage for the past 2 to 3 days.  Denies any fevers.  Recently was around someone who tested positive for COVID.  He has been eating and drinking, maintaining appetite.  Has not used any medicines for symptoms.  Denies shortness of breath.  Has had some chest pain.  Feels this all the time, worse with coughing and deep breathing.  Denies leg pain or leg swelling.  HPI  Past Medical History:  Diagnosis Date  . Medical history non-contributory     There are no active problems to display for this patient.   Past Surgical History:  Procedure Laterality Date  . APPENDECTOMY    . THERAPEUTIC ABORTION      OB History    Gravida  2   Para  1   Term  1   Preterm      AB  1   Living  1     SAB      TAB  1   Ectopic      Multiple      Live Births               Home Medications    Prior to Admission medications   Medication Sig Start Date End Date Taking? Authorizing Provider  benzonatate (TESSALON) 200 MG capsule Take 1 capsule (200 mg total) by mouth 3 (three) times daily as needed for up to 7 days for cough. 11/14/18 11/21/18  Wieters, Hallie C, PA-C  Cetirizine HCl 10 MG CAPS Take 1 capsule (10 mg total) by mouth daily for 10 days. 11/14/18 11/24/18  Wieters, Hallie C, PA-C  fluticasone (FLONASE) 50 MCG/ACT nasal spray Place 1-2 sprays into both nostrils daily for 7 days. 11/14/18 11/21/18  Wieters, Hallie C, PA-C  ibuprofen (ADVIL) 600 MG tablet Take 1 tablet (600 mg total) by mouth every 8 (eight) hours as needed. 11/14/18   Wieters, Hallie C, PA-C  sucralfate (CARAFATE) 1 g tablet Take 1 tablet (1 g total) by  mouth 4 (four) times daily -  with meals and at bedtime for 21 days. 07/05/18 07/26/18  Cristina GongHammond, Elizabeth W, PA-C    Family History Family History  Problem Relation Age of Onset  . Hypertension Mother   . Diabetes Father     Social History Social History   Tobacco Use  . Smoking status: Never Smoker  . Smokeless tobacco: Never Used  Substance Use Topics  . Alcohol use: Never    Frequency: Never  . Drug use: Never     Allergies   Zosyn [piperacillin sod-tazobactam so]   Review of Systems Review of Systems  Constitutional: Negative for activity change, appetite change, chills, fatigue and fever.  HENT: Positive for congestion and rhinorrhea. Negative for ear pain, sinus pressure, sore throat and trouble swallowing.   Eyes: Negative for discharge and redness.  Respiratory: Positive for cough. Negative for chest tightness and shortness of breath.   Cardiovascular: Positive for chest pain. Negative for leg swelling.  Gastrointestinal: Negative for abdominal pain, diarrhea, nausea and vomiting.  Musculoskeletal: Negative for myalgias.  Skin: Negative for rash.  Neurological: Negative for dizziness, light-headedness and headaches.     Physical Exam Triage Vital Signs ED Triage Vitals  Enc Vitals Group     BP 11/14/18 1728 117/66     Pulse Rate 11/14/18 1728 87     Resp 11/14/18 1728 16     Temp 11/14/18 1728 98.9 F (37.2 C)     Temp Source 11/14/18 1728 Oral     SpO2 11/14/18 1728 100 %     Weight --      Height --      Head Circumference --      Peak Flow --      Pain Score 11/14/18 1729 6     Pain Loc --      Pain Edu? --      Excl. in [place]? --    No data found.  Updated Vital Signs BP 117/66 (BP Location: Right Arm)   Pulse 87   Temp 98.9 F (37.2 C) (Oral)   Resp 16   LMP 10/27/2018   SpO2 100%   Visual Acuity Right Eye Distance:   Left Eye Distance:   Bilateral Distance:    Right Eye Near:   Left Eye Near:    Bilateral Near:     Physical  Exam Vitals signs and nursing note reviewed.  Constitutional:      General: She is not in acute distress.    Appearance: She is well-developed.  HENT:     Head: Normocephalic and atraumatic.     Ears:     Comments: Bilateral ears without tenderness to palpation of external auricle, tragus and mastoid, EAC's without erythema or swelling, TM's with good bony landmarks and cone of light. Non erythematous.     Mouth/Throat:     Comments: Oral mucosa pink and moist, no tonsillar enlargement or exudate. Posterior pharynx patent and nonerythematous, no uvula deviation or swelling. Normal phonation.  Eyes:     Conjunctiva/sclera: Conjunctivae normal.  Neck:     Musculoskeletal: Neck supple.  Cardiovascular:     Rate and Rhythm: Normal rate and regular rhythm.     Heart sounds: No murmur.  Pulmonary:     Effort: Pulmonary effort is normal. No respiratory distress.     Breath sounds: Normal breath sounds.     Comments: Breathing comfortably at rest, CTABL, no wheezing, rales or other adventitious sounds auscultated  Anterior chest tender to palpation throughout bilateral areas Abdominal:     Palpations: Abdomen is soft.     Tenderness: There is no abdominal tenderness.  Skin:    General: Skin is warm and dry.  Neurological:     Mental Status: She is alert.      UC Treatments / Results  Labs (all labs ordered are listed, but only abnormal results are displayed) Labs Reviewed  NOVEL CORONAVIRUS, NAA (HOSPITAL ORDER, SEND-OUT TO REF LAB)    EKG   Radiology No results found.  Procedures Procedures (including critical care time)  Medications Ordered in UC Medications - No data to display  Initial Impression / Assessment and Plan / UC Course  I have reviewed the triage vital signs and the nursing notes.  Pertinent labs & imaging results that were available during my care of the patient were reviewed by me and considered in my medical decision making (see chart for details).     COVID swab pending. Patient with URI symptoms x2 to 3 days, vital signs stable, exam unremarkable, most likely viral.  Chest pain seems MSK related  given reproducible on exam and patient reports this is the same.  Will treat with anti-inflammatories.  Zyrtec and Flonase for congestion, Tessalon for cough.  Ibuprofen and Tylenol for chest discomfort.  Continue to monitor,Discussed strict return precautions. Patient verbalized understanding and is agreeable with plan.  Final Clinical Impressions(s) / UC Diagnoses   Final diagnoses:  Viral URI with cough  Close Exposure to Covid-19 Virus     Discharge Instructions       COVID swab is pending Please stay home until results return  Begin daily cetirizine to help with congestion and drainage Flonase nasal spray to help with nasal congestion Tessalon as needed for cough every 8 hours Ibuprofen and Tylenol for chest and back discomfort Rest and drink plenty of fluids  Please follow-up if symptoms not resolving, worsening developing increased pain, shortness of breath, difficulty breathing or fevers     Person Under Monitoring Name: Lilah Mijangos  Location: [address] [place] [address]   Infection Prevention Recommendations for Individuals Confirmed to have, or Being Evaluated for, 2019 Novel Coronavirus (COVID-19) Infection Who Receive Care at Home  Individuals who are confirmed to have, or are being evaluated for, COVID-19 should follow the prevention steps below until a healthcare provider or local or state health department says they can return to normal activities.  Stay home except to get medical care You should restrict activities outside your home, except for getting medical care. Do not go to work, school, or public areas, and do not use public transportation or taxis.  Call ahead before visiting your doctor Before your medical appointment, call the healthcare provider and tell them that you  have, or are being evaluated for, COVID-19 infection. This will help the healthcare provider's office take steps to keep other people from getting infected. Ask your healthcare provider to call the local or state health department.  Monitor your symptoms Seek prompt medical attention if your illness is worsening (e.g., difficulty breathing). Before going to your medical appointment, call the healthcare provider and tell them that you have, or are being evaluated for, COVID-19 infection. Ask your healthcare provider to call the local or state health department.  Wear a facemask You should wear a facemask that covers your nose and mouth when you are in the same room with other people and when you visit a healthcare provider. People who live with or visit you should also wear a facemask while they are in the same room with you.  Separate yourself from other people in your home As much as possible, you should stay in a different room from other people in your home. Also, you should use a separate bathroom, if available.  Avoid sharing household items You should not share dishes, drinking glasses, cups, eating utensils, towels, bedding, or other items with other people in your home. After using these items, you should wash them thoroughly with soap and water.  Cover your coughs and sneezes Cover your mouth and nose with a tissue when you cough or sneeze, or you can cough or sneeze into your sleeve. Throw used tissues in a lined trash can, and immediately wash your hands with soap and water for at least 20 seconds or use an alcohol-based hand rub.  Wash your [facility] your hands often and thoroughly with soap and water for at least 20 seconds. You can use an alcohol-based hand sanitizer if soap and water are not available and if your hands are not visibly dirty. Avoid touching  your eyes, nose, and mouth with unwashed hands.   Prevention Steps for Caregivers and Household Members of  Individuals Confirmed to have, or Being Evaluated for, COVID-19 Infection Being Cared for in the Home  If you live with, or provide care at home for, a person confirmed to have, or being evaluated for, COVID-19 infection please follow these guidelines to prevent infection:  Follow healthcare provider's instructions Make sure that you understand and can help the patient follow any healthcare provider instructions for all care.  Provide for the patient's basic needs You should help the patient with basic needs in the home and provide support for getting groceries, prescriptions, and other personal needs.  Monitor the patient's symptoms If they are getting sicker, call his or her medical provider and tell them that the patient has, or is being evaluated for, COVID-19 infection. This will help the healthcare provider's office take steps to keep other people from getting infected. Ask the healthcare provider to call the local or state health department.  Limit the number of people who have contact with the patient  If possible, have only one caregiver for the patient.  Other household members should stay in another home or place of residence. If this is not possible, they should stay  in another room, or be separated from the patient as much as possible. Use a separate bathroom, if available.  Restrict visitors who do not have an essential need to be in the home.  Keep older adults, very young children, and other sick people away from the patient Keep older adults, very young children, and those who have compromised immune systems or chronic health conditions away from the patient. This includes people with chronic heart, lung, or kidney conditions, diabetes, and cancer.  Ensure good ventilation Make sure that shared spaces in the home have good air flow, such as from an air conditioner or an opened window, weather permitting.  Wash your hands often  Wash your hands often and  thoroughly with soap and water for at least 20 seconds. You can use an alcohol based hand sanitizer if soap and water are not available and if your hands are not visibly dirty.  Avoid touching your eyes, nose, and mouth with unwashed hands.  Use disposable paper towels to dry your hands. If not available, use dedicated cloth towels and replace them when they become wet.  Wear a facemask and gloves  Wear a disposable facemask at all times in the room and gloves when you touch or have contact with the patient's blood, body fluids, and/or secretions or excretions, such as sweat, saliva, sputum, nasal mucus, vomit, urine, or feces.  Ensure the mask fits over your nose and mouth tightly, and do not touch it during use.  Throw out disposable facemasks and gloves after using them. Do not reuse.  Wash your hands immediately after removing your facemask and gloves.  If your personal clothing becomes contaminated, carefully remove clothing and launder. Wash your hands after handling contaminated clothing.  Place all used disposable facemasks, gloves, and other waste in a lined container before disposing them with other household waste.  Remove gloves and wash your hands immediately after handling these items.  Do not share dishes, glasses, or other household items with the patient  Avoid sharing household items. You should not share dishes, drinking glasses, cups, eating utensils, towels, bedding, or other items with a patient who is confirmed to have, or being evaluated for, COVID-19 infection.  After the person uses  these items, you should wash them thoroughly with soap and water.  Wash laundry thoroughly  Immediately remove and wash clothes or bedding that have blood, body fluids, and/or secretions or excretions, such as sweat, saliva, sputum, nasal mucus, vomit, urine, or feces, on them.  Wear gloves when handling laundry from the patient.  Read and follow directions on labels of laundry or  clothing items and detergent. In general, wash and dry with the warmest temperatures recommended on the label.  Clean all areas the individual has used often  Clean all touchable surfaces, such as counters, tabletops, doorknobs, bathroom fixtures, toilets, phones, keyboards, tablets, and bedside tables, every day. Also, clean any surfaces that may have blood, body fluids, and/or secretions or excretions on them.  Wear gloves when cleaning surfaces the patient has come in contact with.  Use a diluted bleach solution (e.g., dilute bleach with 1 part bleach and 10 parts water) or a household disinfectant with a label that says EPA-registered for coronaviruses. To make a bleach solution at home, add 1 tablespoon of bleach to 1 quart (4 cups) of water. For a larger supply, add  cup of bleach to 1 gallon (16 cups) of water.  Read labels of cleaning products and follow recommendations provided on product labels. Labels contain instructions for safe and effective use of the cleaning product including precautions you should take when applying the product, such as wearing gloves or eye protection and making sure you have good ventilation during use of the product.  Remove gloves and wash hands immediately after cleaning.  Monitor yourself for signs and symptoms of illness Caregivers and household members are considered close contacts, should monitor their health, and will be asked to limit movement outside of the home to the extent possible. Follow the monitoring steps for close contacts listed on the symptom monitoring form.   ? If you have additional questions, contact your local health department or call the epidemiologist on call at [phone] (available 24/7). ? This guidance is subject to change. For the most up-to-date guidance from [facility], please refer to their website: [email or web address][email or web address]    ED Prescriptions    Medication Sig Dispense  Auth. Provider   Cetirizine HCl 10 MG CAPS Take 1 capsule (10 mg total) by mouth daily for 10 days. 10 capsule Wieters, Hallie C, PA-C   fluticasone (FLONASE) 50 MCG/ACT nasal spray Place 1-2 sprays into both nostrils daily for 7 days. 1 g Wieters, Hallie C, PA-C   benzonatate (TESSALON) 200 MG capsule Take 1 capsule (200 mg total) by mouth 3 (three) times daily as needed for up to 7 days for cough. 28 capsule Wieters, Hallie C, PA-C   ibuprofen (ADVIL) 600 MG tablet Take 1 tablet (600 mg total) by mouth every 8 (eight) hours as needed. 30 tablet Wieters, [place] C, PA-C     Controlled Substance Prescriptions [place] Controlled Substance Registry consulted? Not Applicable   Lew Dawes, [place] 11/15/18 [phone]

## 2018-12-25 ENCOUNTER — Other Ambulatory Visit: Payer: Self-pay

## 2018-12-25 ENCOUNTER — Ambulatory Visit (HOSPITAL_COMMUNITY)
Admission: EM | Admit: 2018-12-25 | Discharge: 2018-12-25 | Disposition: A | Discharge status: Home / Self Care | Payer: PRIVATE HEALTH INSURANCE | Attending: Family Medicine | Admitting: Family Medicine

## 2018-12-25 ENCOUNTER — Encounter (HOSPITAL_COMMUNITY): Payer: Self-pay

## 2018-12-25 DIAGNOSIS — R05 Cough: Secondary | ICD-10-CM | POA: Diagnosis present

## 2018-12-25 DIAGNOSIS — R059 Cough, unspecified: Secondary | ICD-10-CM

## 2018-12-25 DIAGNOSIS — Z20828 Contact with and (suspected) exposure to other viral communicable diseases: Secondary | ICD-10-CM | POA: Diagnosis present

## 2018-12-25 DIAGNOSIS — R197 Diarrhea, unspecified: Secondary | ICD-10-CM | POA: Diagnosis present

## 2018-12-25 DIAGNOSIS — Z20822 Contact with and (suspected) exposure to covid-19: Secondary | ICD-10-CM

## 2018-12-25 DIAGNOSIS — U071 COVID-19: Secondary | ICD-10-CM | POA: Insufficient documentation

## 2018-12-25 NOTE — Discharge Instructions (Signed)
Small frequent sips of fluids- Pedialyte, Gatorade, water, broth- to maintain hydration.   Bland diet as tolerated.  Advance diet as tolerated.  Push fluids to ensure adequate hydration and keep secretions thin.  Tylenol and/or ibuprofen as needed for pain or fevers.  Rest.  Self isolate until covid results are back and negative.  Will notify you of any positive findings. You may monitor your results on your MyChart online as well.

## 2018-12-25 NOTE — ED Triage Notes (Signed)
Pt report fatigue and diarrhea x 1 week.

## 2018-12-25 NOTE — ED Provider Notes (Signed)
[facility]    CSN: [id number] Arrival date & time: 12/25/18  1212      History   Chief Complaint Chief Complaint  Patient presents with  . Fatigue  . Diarrhea    HPI Tracie Howard is a 23 y.o. female.   Tracie Howard presents with complaints of dry cough, fatigue and loose stools. Started three days ago, when she had more frequent loose stools. Today hasn't had a BM. No blood or black. No fevers. When she coughs she feels shortness of breath . No chest pain . Abdominal discomfort prior to bowel movement. Nausea this morning without vomiting. Has been able to eat and drink. No runny nose, sore throat or ear pain. No recent travel. Doesn't work outside of the home. States her church has had multiple positive cases of covid. Hasn't taken any medications for symptoms. No history of asthma.    ROS per HPI, negative if not otherwise mentioned.      Past Medical History:  Diagnosis Date  . Medical history non-contributory     There are no active problems to display for this patient.   Past Surgical History:  Procedure Laterality Date  . APPENDECTOMY    . THERAPEUTIC ABORTION      OB History    Gravida  2   Para  1   Term  1   Preterm      AB  1   Living  1     SAB      TAB  1   Ectopic      Multiple      Live Births               Home Medications    Prior to Admission medications   Medication Sig Start Date End Date Taking? Authorizing Provider  loratadine (CLARITIN) 10 MG tablet Take 10 mg by mouth daily.   Yes [provider]  Cetirizine HCl 10 MG CAPS Take 1 capsule (10 mg total) by mouth daily for 10 days. 11/14/18 11/24/18  Wieters, Hallie C, PA-C  fluticasone (FLONASE) 50 MCG/ACT nasal spray Place 1-2 sprays into both nostrils daily for 7 days. 11/14/18 11/21/18  Wieters, Hallie C, PA-C  ibuprofen (ADVIL) 600 MG tablet Take 1 tablet (600 mg total) by mouth every 8 (eight) hours as needed. 11/14/18   Wieters, Hallie C,  PA-C  sucralfate (CARAFATE) 1 g tablet Take 1 tablet (1 g total) by mouth 4 (four) times daily -  with meals and at bedtime for 21 days. 07/05/18 07/26/18  Cristina Gong, PA-C    Family History Family History  Problem Relation Age of Onset  . Hypertension Mother   . Diabetes Father     Social History Social History   Tobacco Use  . Smoking status: Never Smoker  . Smokeless tobacco: Never Used  Substance Use Topics  . Alcohol use: Never    Frequency: Never  . Drug use: Never     Allergies   Zosyn [piperacillin sod-tazobactam so]   Review of Systems Review of Systems   Physical Exam Triage Vital Signs ED Triage Vitals  Enc Vitals Group     BP 12/25/18 1231 (!) 122/59     Pulse Rate 12/25/18 1231 (!) 108     Resp 12/25/18 1231 18     Temp 12/25/18 1231 97.6 F (36.4 C)     Temp Source 12/25/18 1231 Temporal     SpO2 12/25/18 1231 100 %  Weight --      Height --      Head Circumference --      Peak Flow --      Pain Score 12/25/18 1228 0     Pain Loc --      Pain Edu? --      Excl. in [place]? --    No data found.  Updated Vital Signs BP (!) 122/59 (BP Location: Left Arm)   Pulse (!) 108   Temp 97.6 F (36.4 C) (Temporal)   Resp 18   SpO2 100%   Physical Exam Constitutional:      General: She is not in acute distress.    Appearance: She is well-developed.  Cardiovascular:     Rate and Rhythm: Regular rhythm.     Heart sounds: Normal heart sounds.  Pulmonary:     Effort: Pulmonary effort is normal.     Breath sounds: Normal breath sounds.  Abdominal:     Tenderness: There is no abdominal tenderness.  Skin:    General: Skin is warm and dry.  Neurological:     Mental Status: She is alert and oriented to person, place, and time.      UC Treatments / Results  Labs (all labs ordered are listed, but only abnormal results are displayed) Labs Reviewed  NOVEL CORONAVIRUS, NAA (HOSP ORDER, SEND-OUT TO REF LAB; TAT 18-24 HRS)    EKG    Radiology No results found.  Procedures Procedures (including critical care time)  Medications Ordered in UC Medications - No data to display  Initial Impression / Assessment and Plan / UC Course  I have reviewed the triage vital signs and the nursing notes.  Pertinent labs & imaging results that were available during my care of the patient were reviewed by me and considered in my medical decision making (see chart for details).     Non toxic. Benign physical exam.  Mild tachycardia on arrival. Afebrile. No loose stools today. History and physical consistent with viral illness.  covid pending. Will notify of any positive findings and if any changes to treatment are needed. Supportive cares recommended. Return precautions provided. Patient verbalized understanding and agreeable to plan.    Final Clinical Impressions(s) / UC Diagnoses   Final diagnoses:  Diarrhea, unspecified type  Cough  Exposure to COVID-19 virus     Discharge Instructions     Small frequent sips of fluids- Pedialyte, Gatorade, water, broth- to maintain hydration.   Bland diet as tolerated.  Advance diet as tolerated.  Push fluids to ensure adequate hydration and keep secretions thin.  Tylenol and/or ibuprofen as needed for pain or fevers.  Rest.  Self isolate until covid results are back and negative.  Will notify you of any positive findings. You may monitor your results on your MyChart online as well.      ED Prescriptions    None     PDMP not reviewed this encounter.   Zigmund Gottron, NP 12/25/18 [phone]

## 2018-12-28 ENCOUNTER — Telehealth (HOSPITAL_COMMUNITY): Payer: Self-pay | Admitting: Emergency Medicine

## 2018-12-28 NOTE — Telephone Encounter (Signed)
Positive covid. Patient contacted and made aware of    results, all questions answered Quarantine info given, pt daughter also positive for covid. All questions answered. Quarantine time ends 01/03/2019 for both of them if no fevers for 24 hours.

## 2018-12-29 LAB — NOVEL CORONAVIRUS, NAA (HOSP ORDER, SEND-OUT TO REF LAB; TAT 18-24 HRS): SARS-CoV-2, NAA: DETECTED — AB

## 2019-02-01 ENCOUNTER — Ambulatory Visit (HOSPITAL_COMMUNITY)
Admission: EM | Admit: 2019-02-01 | Discharge: 2019-02-01 | Disposition: A | Discharge status: Home / Self Care | Payer: PRIVATE HEALTH INSURANCE | Attending: Family Medicine | Admitting: Family Medicine

## 2019-02-01 ENCOUNTER — Other Ambulatory Visit: Payer: Self-pay

## 2019-02-01 ENCOUNTER — Encounter (HOSPITAL_COMMUNITY): Payer: Self-pay

## 2019-02-01 DIAGNOSIS — R102 Pelvic and perineal pain: Secondary | ICD-10-CM | POA: Diagnosis present

## 2019-02-01 DIAGNOSIS — N898 Other specified noninflammatory disorders of vagina: Secondary | ICD-10-CM | POA: Diagnosis present

## 2019-02-01 DIAGNOSIS — Z3202 Encounter for pregnancy test, result negative: Secondary | ICD-10-CM | POA: Diagnosis not present

## 2019-02-01 LAB — POCT URINALYSIS DIP (DEVICE)
Bilirubin Urine: NEGATIVE
Glucose, UA: NEGATIVE mg/dL
Hgb urine dipstick: NEGATIVE
Ketones, ur: NEGATIVE mg/dL
Leukocytes,Ua: NEGATIVE
Nitrite: NEGATIVE
Protein, ur: NEGATIVE mg/dL
Specific Gravity, Urine: 1.02 (ref 1.005–1.030)
Urobilinogen, UA: 0.2 mg/dL (ref 0.0–1.0)
pH: 7.5 (ref 5.0–8.0)

## 2019-02-01 LAB — POCT PREGNANCY, URINE: Preg Test, Ur: NEGATIVE

## 2019-02-01 NOTE — Discharge Instructions (Addendum)
Your urine is normal today which is reassuring.  We will await your vaginal swab and start medications if indicated. Will notify you of any positive findings. You may monitor your results on your MyChart online as well.    Please set up to follow up with gynecology for recheck and to establish care, especially if your symptoms persist.

## 2019-02-01 NOTE — ED Provider Notes (Signed)
[facility]    CSN: [id number] Arrival date & time: 02/01/19  1937      History   Chief Complaint Chief Complaint  Patient presents with  . Urinary Tract Infection  . Vaginitis    HPI Tracie Howard is a 23 y.o. female.   Tracie Howard presents with complaints of increased vaginal discharge with odor, it is clear. Symptoms started 1 week ago. Also noted that she has intermittent low abdominal pain, it seems to be worse at night. Comes and goes during the day. Denies any current pain. Sometimes notices a burning sensation with urination, but no frequency. No blood in urine. Denies any previous similar. States she has been told she has an ovarian cyst, but typically this causes her discomfort during her period. LMP 10/18. No other vaginal bleeding. No back pain. States her pelvis can feel "heavy." history  Of appendectomy, no other abdominal surgeries. Hasn't taken any medications for symptoms. She states she is not sexually active.     ROS per HPI, negative if not otherwise mentioned.      Past Medical History:  Diagnosis Date  . Medical history non-contributory     There are no active problems to display for this patient.   Past Surgical History:  Procedure Laterality Date  . APPENDECTOMY    . THERAPEUTIC ABORTION      OB History    Gravida  2   Para  1   Term  1   Preterm      AB  1   Living  1     SAB      TAB  1   Ectopic      Multiple      Live Births               Home Medications    Prior to Admission medications   Medication Sig Start Date End Date Taking? Authorizing Provider  Cetirizine HCl 10 MG CAPS Take 1 capsule (10 mg total) by mouth daily for 10 days. 11/14/18 11/24/18  Wieters, Hallie C, PA-C  fluticasone (FLONASE) 50 MCG/ACT nasal spray Place 1-2 sprays into both nostrils daily for 7 days. 11/14/18 11/21/18  Wieters, Hallie C, PA-C  ibuprofen (ADVIL) 600 MG tablet Take 1 tablet (600 mg total) by mouth every 8  (eight) hours as needed. 11/14/18   Wieters, Hallie C, PA-C  loratadine (CLARITIN) 10 MG tablet Take 10 mg by mouth daily.    [provider]  sucralfate (CARAFATE) 1 g tablet Take 1 tablet (1 g total) by mouth 4 (four) times daily -  with meals and at bedtime for 21 days. 07/05/18 07/26/18  Cristina Gong, PA-C    Family History Family History  Problem Relation Age of Onset  . Hypertension Mother   . Diabetes Father     Social History Social History   Tobacco Use  . Smoking status: Never Smoker  . Smokeless tobacco: Never Used  Substance Use Topics  . Alcohol use: Never    Frequency: Never  . Drug use: Never     Allergies   Zosyn [piperacillin sod-tazobactam so]   Review of Systems Review of Systems   Physical Exam Triage Vital Signs ED Triage Vitals  Enc Vitals Group     BP 02/01/19 0911 116/68     Pulse Rate 02/01/19 0911 72     Resp 02/01/19 0911 18     Temp 02/01/19 0911 97.8 F (36.6 C)  Temp Source 02/01/19 0911 Temporal     SpO2 02/01/19 0911 96 %     Weight --      Height --      Head Circumference --      Peak Flow --      Pain Score 02/01/19 0919 6     Pain Loc --      Pain Edu? --      Excl. in [place]? --    No data found.  Updated Vital Signs BP 116/68 (BP Location: Left Arm)   Pulse 72   Temp 97.8 F (36.6 C) (Temporal)   Resp 18   LMP 01/10/2019   SpO2 96%    Physical Exam Constitutional:      General: She is not in acute distress.    Appearance: She is well-developed.  Cardiovascular:     Rate and Rhythm: Normal rate.     Heart sounds: Normal heart sounds.  Pulmonary:     Effort: Pulmonary effort is normal.  Abdominal:     Palpations: Abdomen is soft. Abdomen is not rigid.     Tenderness: There is no abdominal tenderness. There is no right CVA tenderness, left CVA tenderness, guarding or rebound.  Genitourinary:    Comments: Denies sores, lesions, vaginal bleeding; no pelvic pain; gu exam deferred at this time,  vaginal self swab collected.   Skin:    General: Skin is warm and dry.  Neurological:     Mental Status: She is alert and oriented to person, place, and time.      UC Treatments / Results  Labs (all labs ordered are listed, but only abnormal results are displayed) Labs Reviewed  POC URINE PREG, ED  POCT URINALYSIS DIP (DEVICE)  POCT PREGNANCY, URINE  CERVICOVAGINAL ANCILLARY ONLY    EKG   Radiology No results found.  Procedures Procedures (including critical care time)  Medications Ordered in UC Medications - No data to display  Initial Impression / Assessment and Plan / UC Course  I have reviewed the triage vital signs and the nursing notes.  Pertinent labs & imaging results that were available during my care of the patient were reviewed by me and considered in my medical decision making (see chart for details).     Non toxic. Benign physical exam.  Afebrile.without acute findings on abdominal exam. Negative UA and pregnancy. Vaginal self- swab collected and pending, will await results before initiating treatment. Encouraged establish with gynecology. Return precautions provided. Patient verbalized understanding and agreeable to plan. Ambulatory out of clinic without difficulty.    Final Clinical Impressions(s) / UC Diagnoses   Final diagnoses:  Vaginal discharge  Pelvic cramping     Discharge Instructions     Your urine is normal today which is reassuring.  We will await your vaginal swab and start medications if indicated. Will notify you of any positive findings. You may monitor your results on your MyChart online as well.    Please set up to follow up with gynecology for recheck and to establish care, especially if your symptoms persist.    ED Prescriptions    None     PDMP not reviewed this encounter.   Zigmund Gottron, NP 02/01/19 1014

## 2019-02-01 NOTE — ED Triage Notes (Signed)
Pt presents with vaginal burning, frequent urination, vaginal discharge, lower pelvic pain, and lower back pain X 1 week.

## 2019-02-04 LAB — CERVICOVAGINAL ANCILLARY ONLY
Bacterial vaginitis: NEGATIVE
Candida vaginitis: NEGATIVE
Chlamydia: NEGATIVE
Neisseria Gonorrhea: NEGATIVE
Trichomonas: NEGATIVE

## 2019-02-08 ENCOUNTER — Emergency Department (HOSPITAL_COMMUNITY): Payer: PRIVATE HEALTH INSURANCE

## 2019-02-08 ENCOUNTER — Other Ambulatory Visit: Payer: Self-pay

## 2019-02-08 ENCOUNTER — Encounter (HOSPITAL_COMMUNITY): Payer: Self-pay

## 2019-02-08 ENCOUNTER — Emergency Department (HOSPITAL_COMMUNITY)
Admission: EM | Admit: 2019-02-08 | Discharge: 2019-02-08 | Disposition: A | Discharge status: Home / Self Care | Payer: PRIVATE HEALTH INSURANCE | Attending: Emergency Medicine | Admitting: Emergency Medicine

## 2019-02-08 ENCOUNTER — Ambulatory Visit (INDEPENDENT_AMBULATORY_CARE_PROVIDER_SITE_OTHER)
Admission: EM | Admit: 2019-02-08 | Discharge: 2019-02-08 | Disposition: A | Discharge status: Home / Self Care | Payer: PRIVATE HEALTH INSURANCE | Source: Home / Self Care | Attending: Internal Medicine | Admitting: Internal Medicine

## 2019-02-08 ENCOUNTER — Encounter (HOSPITAL_COMMUNITY): Payer: Self-pay | Admitting: Emergency Medicine

## 2019-02-08 DIAGNOSIS — N739 Female pelvic inflammatory disease, unspecified: Secondary | ICD-10-CM | POA: Diagnosis not present

## 2019-02-08 DIAGNOSIS — R102 Pelvic and perineal pain: Secondary | ICD-10-CM

## 2019-02-08 DIAGNOSIS — Z3202 Encounter for pregnancy test, result negative: Secondary | ICD-10-CM

## 2019-02-08 DIAGNOSIS — N730 Acute parametritis and pelvic cellulitis: Secondary | ICD-10-CM

## 2019-02-08 DIAGNOSIS — Z79899 Other long term (current) drug therapy: Secondary | ICD-10-CM | POA: Insufficient documentation

## 2019-02-08 LAB — URINALYSIS, ROUTINE W REFLEX MICROSCOPIC
Bilirubin Urine: NEGATIVE
Glucose, UA: NEGATIVE mg/dL
Hgb urine dipstick: NEGATIVE
Ketones, ur: NEGATIVE mg/dL
Leukocytes,Ua: NEGATIVE
Nitrite: NEGATIVE
Protein, ur: NEGATIVE mg/dL
Specific Gravity, Urine: 1.019 (ref 1.005–1.030)
pH: 5 (ref 5.0–8.0)

## 2019-02-08 LAB — COMPREHENSIVE METABOLIC PANEL
ALT: 13 U/L (ref 0–44)
AST: 13 U/L — ABNORMAL LOW (ref 15–41)
Albumin: 4.1 g/dL (ref 3.5–5.0)
Alkaline Phosphatase: 42 U/L (ref 38–126)
Anion gap: 8 (ref 5–15)
BUN: 5 mg/dL — ABNORMAL LOW (ref 6–20)
CO2: 23 mmol/L (ref 22–32)
Calcium: 9.5 mg/dL (ref 8.9–10.3)
Chloride: 106 mmol/L (ref 98–111)
Creatinine, Ser: 0.79 mg/dL (ref 0.44–1.00)
GFR calc Af Amer: >60 mL/min (ref >60)
GFR calc non Af Amer: >60 mL/min (ref >60)
Glucose, Bld: 91 mg/dL (ref 70–99)
Potassium: 3.9 mmol/L (ref 3.5–5.1)
Sodium: 137 mmol/L (ref 135–145)
Total Bilirubin: 0.4 mg/dL (ref 0.3–1.2)
Total Protein: 7.7 g/dL (ref 6.5–8.1)

## 2019-02-08 LAB — CBC
HCT: 37.2 % (ref 36.0–46.0)
Hemoglobin: 12.6 g/dL (ref 12.0–15.0)
MCH: 32.4 pg (ref 26.0–34.0)
MCHC: 33.9 g/dL (ref 30.0–36.0)
MCV: 95.6 fL (ref 80.0–100.0)
Platelets: 271 10*3/uL (ref 150–400)
RBC: 3.89 MIL/uL (ref 3.87–5.11)
RDW: 11.9 % (ref 11.5–15.5)
WBC: 5.9 10*3/uL (ref 4.0–10.5)
nRBC: 0 % (ref 0.0–0.2)

## 2019-02-08 LAB — POCT URINALYSIS DIP (DEVICE)
Bilirubin Urine: NEGATIVE
Glucose, UA: NEGATIVE mg/dL
Hgb urine dipstick: NEGATIVE
Leukocytes,Ua: NEGATIVE
Nitrite: NEGATIVE
Protein, ur: NEGATIVE mg/dL
Specific Gravity, Urine: 1.02 (ref 1.005–1.030)
Urobilinogen, UA: 0.2 mg/dL (ref 0.0–1.0)
pH: 7 (ref 5.0–8.0)

## 2019-02-08 LAB — LIPASE, BLOOD: Lipase: 26 U/L (ref 11–51)

## 2019-02-08 LAB — I-STAT BETA HCG BLOOD, ED (MC, WL, AP ONLY): I-stat hCG, quantitative: <5 m[IU]/mL (ref <5)

## 2019-02-08 LAB — WET PREP, GENITAL
Clue Cells Wet Prep HPF POC: NONE SEEN
Sperm: NONE SEEN
Trich, Wet Prep: NONE SEEN
Yeast Wet Prep HPF POC: NONE SEEN

## 2019-02-08 LAB — POCT PREGNANCY, URINE: Preg Test, Ur: NEGATIVE

## 2019-02-08 MED ORDER — CEFTRIAXONE SODIUM 250 MG IJ SOLR: 250.0000 mg | Freq: Once | INTRAMUSCULAR | Status: DC

## 2019-02-08 MED ORDER — KETOROLAC TROMETHAMINE 15 MG/ML IJ SOLN
30.0000 mg | Freq: Once | INTRAMUSCULAR | Status: AC
  Administered 2019-02-08: 30 mg via INTRAMUSCULAR
  Filled 2019-02-08: qty 2

## 2019-02-08 MED ORDER — SODIUM CHLORIDE 0.9% FLUSH: 3.0000 mL | Freq: Once | INTRAVENOUS | Status: DC

## 2019-02-08 MED ORDER — KETOROLAC TROMETHAMINE 15 MG/ML IJ SOLN: 15.0000 mg | Freq: Once | INTRAMUSCULAR | Status: DC

## 2019-02-08 MED ORDER — DOXYCYCLINE HYCLATE 100 MG PO CAPS: 100.0000 mg | ORAL_CAPSULE | Freq: Two times a day (BID) | ORAL | 0 refills | Status: DC

## 2019-02-08 MED ORDER — NAPROXEN 500 MG PO TABS: 500.0000 mg | ORAL_TABLET | Freq: Two times a day (BID) | ORAL | 0 refills | Status: DC

## 2019-02-08 NOTE — ED Notes (Signed)
Patient transported to Ultrasound 

## 2019-02-08 NOTE — ED Provider Notes (Signed)
[facility] EMERGENCY DEPARTMENT Provider Note   CSN: [id number] Arrival date & time: 02/08/19  1403     History   Chief Complaint Chief Complaint  Patient presents with   Abdominal Pain   Nausea    HPI Tracie Howard is a 23 y.o. female with a history of prior appendectomy who presents to the emergency department with complaints of pelvic pain intermittently for the past 4 days. Pain started after intercourse.  Patient states the pain is located to the suprapubic region, it occurs intermittently, it is crampy in nature, seems to occur more so with movement and palpation, no alleviating factors.  She is had some nausea as well as some vaginal discharge that she describes as clear and nonpruritic, the discharge seems to be improving however.  She is sexually active and does have some does concern for STD.  Denies fever, chills, diarrhea, melena, hematochezia, dysuria, or vaginal bleeding.  LMP 4 weeks prior.     HPI  Past Medical History:  Diagnosis Date   Medical history non-contributory     There are no active problems to display for this patient.   Past Surgical History:  Procedure Laterality Date   APPENDECTOMY     THERAPEUTIC ABORTION       OB History    Gravida  2   Para  1   Term  1   Preterm      AB  1   Living  1     SAB      TAB  1   Ectopic      Multiple      Live Births               Home Medications    Prior to Admission medications   Medication Sig Start Date End Date Taking? Authorizing Provider  Cetirizine HCl 10 MG CAPS Take 1 capsule (10 mg total) by mouth daily for 10 days. 11/14/18 02/08/19  Wieters, Hallie C, PA-C  fluticasone (FLONASE) 50 MCG/ACT nasal spray Place 1-2 sprays into both nostrils daily for 7 days. 11/14/18 02/08/19  Wieters, Hallie C, PA-C  loratadine (CLARITIN) 10 MG tablet Take 10 mg by mouth daily.  02/08/19  [provider]  sucralfate (CARAFATE) 1 g tablet Take 1 tablet (1  g total) by mouth 4 (four) times daily -  with meals and at bedtime for 21 days. 07/05/18 02/08/19  Cristina Gong, PA-C    Family History Family History  Problem Relation Age of Onset   Hypertension Mother    Diabetes Father     Social History Social History   Tobacco Use   Smoking status: Never Smoker   Smokeless tobacco: Never Used  Substance Use Topics   Alcohol use: Never    Frequency: Never   Drug use: Never     Allergies   Zosyn [piperacillin sod-tazobactam so]   Review of Systems Review of Systems  Constitutional: Negative for chills and fever.  Respiratory: Negative for shortness of breath.   Cardiovascular: Negative for chest pain.  Gastrointestinal: Positive for abdominal pain and nausea. Negative for anal bleeding, blood in stool, constipation, diarrhea and vomiting.  Genitourinary: Positive for pelvic pain and vaginal discharge. Negative for dysuria, frequency and vaginal bleeding.  All other systems reviewed and are negative.    Physical Exam Updated Vital Signs BP 106/64    Pulse 62    Temp 98.7 F (37.1 C) (Oral)    Resp 14  Ht 5\' 7"  (1.702 m)    Wt 63 kg    LMP 01/10/2019    SpO2 100%    BMI 21.75 kg/m    Physical Exam Vitals signs and nursing note reviewed. Exam conducted with a chaperone present.  Constitutional:      General: She is not in acute distress.    Appearance: She is well-developed. She is not toxic-appearing.  HENT:     Head: Normocephalic and atraumatic.  Eyes:     General:        Right eye: No discharge.        Left eye: No discharge.     Conjunctiva/sclera: Conjunctivae normal.  Neck:     Musculoskeletal: Neck supple.  Cardiovascular:     Rate and Rhythm: Normal rate and regular rhythm.  Pulmonary:     Effort: Pulmonary effort is normal. No respiratory distress.     Breath sounds: Normal breath sounds. No wheezing, rhonchi or rales.  Abdominal:     General: There is no distension.     Palpations: Abdomen  is soft.     Tenderness: There is abdominal tenderness in the suprapubic area. There is no guarding or rebound.  Genitourinary:    Comments: Diffuse discomfort throughout pelvic exam, bilateral adnexal tenderness and cervical motion tenderness present. No significant amount of discharge. No bleeding.  Skin:    General: Skin is warm and dry.     Findings: No rash.  Neurological:     Mental Status: She is alert.     Comments: Clear speech.   Psychiatric:        Behavior: Behavior normal.    ED Treatments / Results  Labs (all labs ordered are listed, but only abnormal results are displayed) Labs Reviewed  COMPREHENSIVE METABOLIC PANEL - Abnormal; Notable for the following components:      Result Value   BUN 5 (*)    AST 13 (*)    All other components within normal limits  WET PREP, GENITAL  LIPASE, BLOOD  CBC  URINALYSIS, ROUTINE W REFLEX MICROSCOPIC  RPR  HIV ANTIBODY (ROUTINE TESTING W REFLEX)  I-STAT BETA HCG BLOOD, ED ([facility], [facility], [facility] ONLY)  GC/CHLAMYDIA PROBE AMP () NOT AT [facility]    EKG None  Radiology US Transvaginal Non-ob  Result Date: 02/08/2019 CLINICAL DATA:  Pelvic pain EXAM: TRANSABDOMINAL AND TRANSVAGINAL ULTRASOUND OF PELVIS DOPPLER ULTRASOUND OF OVARIES TECHNIQUE: Both transabdominal and transvaginal ultrasound examinations of the pelvis were performed. Transabdominal technique was performed for global imaging of the pelvis including uterus, ovaries, adnexal regions, and pelvic cul-de-sac. It was necessary to proceed with endovaginal exam following the transabdominal exam to visualize the uterus endometrium ovaries. Color and duplex Doppler ultrasound was utilized to evaluate blood flow to the ovaries. COMPARISON:  05/16/2018 FINDINGS: Uterus Measurements: 7.7 x 4.4 x 5.4 cm = volume: 97.1 mL. No fibroids or other mass visualized. Endometrium Thickness: 1.4 mm.  No focal abnormality visualized. Right ovary Measurements: 3.6 x 1.4 x 2.3 cm = volume: 6.1 mL.  Normal appearance/no adnexal mass. Left ovary Measurements: 3.6 x 2.2 x 1.8 cm = volume: 7.1 mL. Normal appearance/no adnexal mass. Pulsed Doppler evaluation of both ovaries demonstrates normal low-resistance arterial and venous waveforms. Other findings No abnormal free fluid. IMPRESSION: Negative pelvic ultrasound.  No evidence for torsion. Electronically Signed   By: Donavan Foil M.D.   On: 02/08/2019 18:31   US Pelvis Complete  Result Date: 02/08/2019 CLINICAL DATA:  Pelvic pain EXAM: TRANSABDOMINAL AND  TRANSVAGINAL ULTRASOUND OF PELVIS DOPPLER ULTRASOUND OF OVARIES TECHNIQUE: Both transabdominal and transvaginal ultrasound examinations of the pelvis were performed. Transabdominal technique was performed for global imaging of the pelvis including uterus, ovaries, adnexal regions, and pelvic cul-de-sac. It was necessary to proceed with endovaginal exam following the transabdominal exam to visualize the uterus endometrium ovaries. Color and duplex Doppler ultrasound was utilized to evaluate blood flow to the ovaries. COMPARISON:  05/16/2018 FINDINGS: Uterus Measurements: 7.7 x 4.4 x 5.4 cm = volume: 97.1 mL. No fibroids or other mass visualized. Endometrium Thickness: 1.4 mm.  No focal abnormality visualized. Right ovary Measurements: 3.6 x 1.4 x 2.3 cm = volume: 6.1 mL. Normal appearance/no adnexal mass. Left ovary Measurements: 3.6 x 2.2 x 1.8 cm = volume: 7.1 mL. Normal appearance/no adnexal mass. Pulsed Doppler evaluation of both ovaries demonstrates normal low-resistance arterial and venous waveforms. Other findings No abnormal free fluid. IMPRESSION: Negative pelvic ultrasound.  No evidence for torsion. Electronically Signed   By: Jasmine PangKim  Fujinaga M.D.   On: 02/08/2019 18:31   [place] Art/ven Flow Abd Pelv Doppler  Result Date: 02/08/2019 CLINICAL DATA:  Pelvic pain EXAM: TRANSABDOMINAL AND TRANSVAGINAL ULTRASOUND OF PELVIS DOPPLER ULTRASOUND OF OVARIES TECHNIQUE: Both transabdominal and transvaginal  ultrasound examinations of the pelvis were performed. Transabdominal technique was performed for global imaging of the pelvis including uterus, ovaries, adnexal regions, and pelvic cul-de-sac. It was necessary to proceed with endovaginal exam following the transabdominal exam to visualize the uterus endometrium ovaries. Color and duplex Doppler ultrasound was utilized to evaluate blood flow to the ovaries. COMPARISON:  05/16/2018 FINDINGS: Uterus Measurements: 7.7 x 4.4 x 5.4 cm = volume: 97.1 mL. No fibroids or other mass visualized. Endometrium Thickness: 1.4 mm.  No focal abnormality visualized. Right ovary Measurements: 3.6 x 1.4 x 2.3 cm = volume: 6.1 mL. Normal appearance/no adnexal mass. Left ovary Measurements: 3.6 x 2.2 x 1.8 cm = volume: 7.1 mL. Normal appearance/no adnexal mass. Pulsed Doppler evaluation of both ovaries demonstrates normal low-resistance arterial and venous waveforms. Other findings No abnormal free fluid. IMPRESSION: Negative pelvic ultrasound.  No evidence for torsion. Electronically Signed   By: Jasmine PangKim  Fujinaga M.D.   On: 02/08/2019 18:31    Procedures Procedures (including critical care time)  Medications Ordered in ED Medications  sodium chloride flush (NS) 0.9 % injection 3 mL (has no administration in time range)     Initial Impression / Assessment and Plan / ED Course  I have reviewed the triage vital signs and the nursing notes.  Pertinent labs & imaging results that were available during my care of the patient were reviewed by me and considered in my medical decision making (see chart for details).   Patient presents to the emergency department with complaints of intermittent pelvic pain for the past 4 days.  She is nontoxic-appearing, resting comfortably, vitals without significant abnormality, initially documented hypoxia was suspected to be an error as patient remains 100% on each of my assessments and is not having respiratory sxs.  She has suprapubic  tenderness as well as bilateral adnexal and cervical motion tenderness.  DDx: PID, ovarian cyst, ovarian torsion, menstrual cramping, UTI, IUP, ectopic pregnancy  CBC: No leukocytosis or anemia CMP: No significant electrolyte derangement Lipase: WNL Pregnancy test: Negative Urinalysis: No UTI Wet prep: Many WBCs, no trichomoniasis, yeast, or BV. HIV, RPR, GC, and Chlamydia are pending.  Negative pelvic ultrasound.  Overall reassuring work-up.  Given her cervical motion tenderness and bilateral adnexal tenderness feel the treatment for PID  is reasonable.  Will give ceftriaxone in the ED per discussion w/ pharmacy given zosyn allergy- has had keflex before.  Discharged home on doxycycline and NSAIDs.  OB/GYN follow-up. I discussed results, treatment plan, need for follow-up, and return precautions with the patient. Provided opportunity for questions, patient confirmed understanding and is in agreement with plan.   Final Clinical Impressions(s) / ED Diagnoses   Final diagnoses:  Pelvic pain  PID (acute pelvic inflammatory disease)    ED Discharge Orders         Ordered    naproxen (NAPROSYN) 500 MG tablet  2 times daily     02/08/19 2028    doxycycline (VIBRAMYCIN) 100 MG capsule  2 times daily     02/08/19 2028           Cherly Anderson, PA-C 02/08/19 2029    Geoffery Lyons, MD 02/08/19 2322

## 2019-02-08 NOTE — ED Triage Notes (Signed)
Pt presents to the UC with abdominal pain and leg cramps x 2 days. Pt states the abdominal pain and leg cramping started 2 days ago after she had sex. Pt states it was painful when she was having sex, she stopped because of the pain.

## 2019-02-08 NOTE — ED Notes (Signed)
Discharge instructions discussed with pt. Pt verbalized understanding. Pt stable and ambulatory. No signature pad available. Pt refusing to wait for discharge vitals.

## 2019-02-08 NOTE — ED Triage Notes (Signed)
Pt complains of abd cramping for two days. Painful after having sex two days ago. Pt states she was having discharge a week ago, and was seen. But never received a call about any STDs. Pt complains of nausea but not diarrhea.

## 2019-02-08 NOTE — Discharge Instructions (Signed)
You were seen in the emergency department today for pelvic pain.  Your ultrasound was normal.  Your labs did not show substantial abnormalities.  We tested you for STDs including gonorrhea, chlamydia, HIV, and syphilis, we will call you if these results are positive.  We are concerned that she may have what is called pelvic inflammatory disease which is pelvic pain with potential infection.  If your STD testing is positive is important that you inform all sexual partners.  We are treating your potential infection with antibiotics, doxycycline, please take this as prescribed with food to avoid stomach upset.  We are also sending her with naproxen to help with pain.  - Naproxen is a nonsteroidal anti-inflammatory medication that will help with pain and swelling. Be sure to take this medication as prescribed with food, 1 pill every 12 hours,  It should be taken with food, as it can cause stomach upset, and more seriously, stomach bleeding. Do not take other nonsteroidal anti-inflammatory medications with this such as Advil, Motrin, Aleve, Mobic, Goodie Powder, or Motrin.    You make take Tylenol per over the counter dosing with these medications.   We have prescribed you new medication(s) today. Discuss the medications prescribed today with your pharmacist as they can have adverse effects and interactions with your other medicines including over the counter and prescribed medications. Seek medical evaluation if you start to experience new or abnormal symptoms after taking one of these medicines, seek care immediately if you start to experience difficulty breathing, feeling of your throat closing, facial swelling, or rash as these could be indications of a more serious allergic reaction  Please follow-up with OB/GYN within the next 3 days.  Return to the ER for new or worsening symptoms including but not limited to worsening pain, fever, inability to keep fluids down, passing out, or any other  concerns.

## 2019-02-08 NOTE — ED Provider Notes (Signed)
[facility]    CSN: [id number] Arrival date & time: 02/08/19  1153      History   Chief Complaint Chief Complaint  Patient presents with  . Follow-up  . Abdominal Pain  . Leg Problem    HPI Tracie Howard is a 23 y.o. female with no past medical history comes to urgent care with complaints of lower abdominal pain of 2 days duration.  Patient says the pain is of moderate severity.  This started 2 days ago after sexual intercourse.  Pain is throbbing, constant, aggravated by movement she denies any known relieving factors.  No radiation of pain.  She denies association with dysuria, urgency or frequency.  She has increased vaginal discharge after sexual intercourse.  Partner ejaculated during the sexual intercourse.  No nausea or vomiting.  No fever or chills.   HPI  Past Medical History:  Diagnosis Date  . Medical history non-contributory     There are no active problems to display for this patient.   Past Surgical History:  Procedure Laterality Date  . APPENDECTOMY    . THERAPEUTIC ABORTION      OB History    Gravida  2   Para  1   Term  1   Preterm      AB  1   Living  1     SAB      TAB  1   Ectopic      Multiple      Live Births               Home Medications    Prior to Admission medications   Medication Sig Start Date End Date Taking? Authorizing Provider  Cetirizine HCl 10 MG CAPS Take 1 capsule (10 mg total) by mouth daily for 10 days. 11/14/18 02/08/19  Wieters, Hallie C, PA-C  fluticasone (FLONASE) 50 MCG/ACT nasal spray Place 1-2 sprays into both nostrils daily for 7 days. 11/14/18 02/08/19  Wieters, Hallie C, PA-C  loratadine (CLARITIN) 10 MG tablet Take 10 mg by mouth daily.  02/08/19  [provider]  sucralfate (CARAFATE) 1 g tablet Take 1 tablet (1 g total) by mouth 4 (four) times daily -  with meals and at bedtime for 21 days. 07/05/18 02/08/19  Lorin Glass, PA-C    Family History Family History   Problem Relation Age of Onset  . Hypertension Mother   . Diabetes Father     Social History Social History   Tobacco Use  . Smoking status: Never Smoker  . Smokeless tobacco: Never Used  Substance Use Topics  . Alcohol use: Never    Frequency: Never  . Drug use: Never     Allergies   Zosyn [piperacillin sod-tazobactam so]   Review of Systems Review of Systems  Constitutional: Negative for activity change, chills, fatigue and fever.  HENT: Negative.   Eyes: Negative.   Respiratory: Negative.   Cardiovascular: Negative.   Gastrointestinal: Positive for abdominal pain. Negative for nausea and vomiting.  Genitourinary: Positive for dyspareunia, pelvic pain and vaginal discharge. Negative for decreased urine volume, dysuria, enuresis, flank pain, genital sores, menstrual problem, urgency, vaginal bleeding and vaginal pain.  Musculoskeletal: Negative.   Skin: Negative.   Neurological: Negative for dizziness, weakness and headaches.     Physical Exam Triage Vital Signs ED Triage Vitals  Enc Vitals Group     BP 02/08/19 1233 105/69     Pulse Rate 02/08/19 1233 80     Resp  02/08/19 1233 15     Temp 02/08/19 1233 99.2 F (37.3 C)     Temp Source 02/08/19 1233 Oral     SpO2 02/08/19 1233 100 %     Weight --      Height --      Head Circumference --      Peak Flow --      Pain Score 02/08/19 1231 6     Pain Loc --      Pain Edu? --      Excl. in GC? --    No data found.  Updated Vital Signs BP 105/69 (BP Location: Left Arm)   Pulse 80   Temp 99.2 F (37.3 C) (Oral)   Resp 15   LMP  (Within Weeks) Comment: 4 weeks aprox  SpO2 100%   Visual Acuity Right Eye Distance:   Left Eye Distance:   Bilateral Distance:    Right Eye Near:   Left Eye Near:    Bilateral Near:     Physical Exam Vitals signs and nursing note reviewed.  Constitutional:      General: She is not in acute distress.    Appearance: She is well-developed. She is not ill-appearing.   Cardiovascular:     Heart sounds: Normal heart sounds.  Pulmonary:     Effort: Pulmonary effort is normal.     Breath sounds: Normal breath sounds.  Abdominal:     General: Abdomen is protuberant. Bowel sounds are normal. There is no distension. There are no signs of injury.     Palpations: Abdomen is soft. There is no shifting dullness, fluid wave, splenomegaly or mass.     Tenderness: There is abdominal tenderness in the right lower quadrant, suprapubic area and left lower quadrant. There is guarding. There is no rebound.     Hernia: No hernia is present.  Skin:    Capillary Refill: Capillary refill takes less than 2 seconds.  Neurological:     General: No focal deficit present.     Mental Status: She is alert and oriented to person, place, and time.  Psychiatric:        Mood and Affect: Mood normal.        Behavior: Behavior normal.      UC Treatments / Results  Labs (all labs ordered are listed, but only abnormal results are displayed) Labs Reviewed - No data to display  EKG   Radiology No results found.  Procedures Procedures (including critical care time)  Medications Ordered in UC Medications - No data to display  Initial Impression / Assessment and Plan / UC Course  I have reviewed the triage vital signs and the nursing notes.  Pertinent labs & imaging results that were available during my care of the patient were reviewed by me and considered in my medical decision making (see chart for details).     1.  Pelvic pain following sexual intercourse: Urine pregnancy test is negative Urinalysis is negative for urinary tract infection Patient has a history of recurrent pelvic pain and a history of ovarian cyst. Patient's recent symptoms may be related to sexual trauma or possibly PID. Patient is recommended to go to the emergency department for further work-up.  This may include pelvic ultrasound to evaluate for PID versus sexual trauma. Final Clinical  Impressions(s) / UC Diagnoses   Final diagnoses:  Pelvic pain in female   Discharge Instructions   None    ED Prescriptions    None  PDMP not reviewed this encounter.   Merrilee Jansky, MD 02/08/19 [phone]

## 2019-02-09 LAB — RPR: RPR Ser Ql: NONREACTIVE

## 2019-02-09 LAB — HIV ANTIBODY (ROUTINE TESTING W REFLEX): HIV Screen 4th Generation wRfx: NONREACTIVE — AB

## 2019-02-10 LAB — GC/CHLAMYDIA PROBE AMP (~~LOC~~) NOT AT ARMC
Chlamydia: NEGATIVE
Neisseria Gonorrhea: NEGATIVE

## 2019-03-31 ENCOUNTER — Ambulatory Visit (HOSPITAL_COMMUNITY)
Admission: EM | Admit: 2019-03-31 | Discharge: 2019-03-31 | Disposition: A | Discharge status: Home / Self Care | Payer: PRIVATE HEALTH INSURANCE | Attending: Family Medicine | Admitting: Family Medicine

## 2019-03-31 ENCOUNTER — Encounter (HOSPITAL_COMMUNITY): Payer: Self-pay

## 2019-03-31 ENCOUNTER — Other Ambulatory Visit: Payer: Self-pay

## 2019-03-31 DIAGNOSIS — R102 Pelvic and perineal pain: Secondary | ICD-10-CM | POA: Diagnosis present

## 2019-03-31 DIAGNOSIS — Z3202 Encounter for pregnancy test, result negative: Secondary | ICD-10-CM

## 2019-03-31 DIAGNOSIS — K5900 Constipation, unspecified: Secondary | ICD-10-CM | POA: Diagnosis present

## 2019-03-31 DIAGNOSIS — R1030 Lower abdominal pain, unspecified: Secondary | ICD-10-CM | POA: Insufficient documentation

## 2019-03-31 DIAGNOSIS — Z8742 Personal history of other diseases of the female genital tract: Secondary | ICD-10-CM | POA: Diagnosis present

## 2019-03-31 LAB — POCT URINALYSIS DIP (DEVICE)
Bilirubin Urine: NEGATIVE
Glucose, UA: NEGATIVE mg/dL
Hgb urine dipstick: NEGATIVE
Ketones, ur: NEGATIVE mg/dL
Leukocytes,Ua: NEGATIVE
Nitrite: NEGATIVE
Protein, ur: NEGATIVE mg/dL
Specific Gravity, Urine: 1.015 (ref 1.005–1.030)
Urobilinogen, UA: 0.2 mg/dL (ref 0.0–1.0)
pH: 6.5 (ref 5.0–8.0)

## 2019-03-31 LAB — POCT PREGNANCY, URINE: Preg Test, Ur: NEGATIVE

## 2019-03-31 LAB — HCG, QUANTITATIVE, PREGNANCY: hCG, Beta Chain, Quant, S: <1 m[IU]/mL (ref <5)

## 2019-03-31 LAB — POC URINE PREG, ED
Preg Test, Ur: NEGATIVE
Preg Test, Ur: NEGATIVE

## 2019-03-31 NOTE — ED Triage Notes (Signed)
Pt states she has abdominal pain. Pt states she has done 5 pregnancy test and 2 were negative and 3 were positive. Pt states she's not sure what's going on.

## 2019-03-31 NOTE — Discharge Instructions (Addendum)
Try to stay active physically including regular exercise 2-3 times a week.  Make sure you hydrate well every day with about 64 ounces of water daily (that is 2 liters).  Try to avoid carb heavy foods, dairy. This includes cutting out breads, pasta, pizza, pastries, potatoes, rice, starchy foods in general. Eat more fiber as listed below:  Salads - kale, spinach, cabbage, spring mix; use seeds like pumpkin seeds or sunflower seeds, almonds; you can also use 1-2 hard boiled eggs in your salads Fruits - avocadoes, berries (blueberries, raspberries, blackberries), apples, oranges, pomegranate, grapefruit Vegetables - aspargus, cauliflower, broccoli, green beans, brussel spouts, bell peppers; stay away from starchy vegetables like potatoes, carrots, peas  Regarding meat it is better to eat lean meats and limit your red meat consumption including pork.  Wild caught fish, chicken breast are good options.  Do not eat any foods on this list that you are allergic to.

## 2019-03-31 NOTE — ED Provider Notes (Signed)
[facility]   MRN: [id number] DOB: [birth year]  Subjective:   Tracie Howard is a 24 y.o. female presenting for 1 week history of lower abdominal/pelvic pain that is intermittent. Patient has taken multiple pregnancy tests in the past month. Has had 3 positives and 2 negatives. Has a hx of ovarian cyst that resolved on its own. Has also had difficulty with constipation. Does not eat a lot of fiber.  This constipation is more recent, is not having a bowel movement every day and has to strain as well.  Denies taking chronic medications.    Allergies  Allergen Reactions  . Zosyn [Piperacillin Sod-Tazobactam So] Rash    Has tolerated keflex    Past Medical History:  Diagnosis Date  . Medical history non-contributory      Past Surgical History:  Procedure Laterality Date  . APPENDECTOMY    . THERAPEUTIC ABORTION      Family History  Problem Relation Age of Onset  . Hypertension Mother   . Diabetes Father     Social History   Tobacco Use  . Smoking status: Never Smoker  . Smokeless tobacco: Never Used  Substance Use Topics  . Alcohol use: Never  . Drug use: Never    Review of Systems  Constitutional: Negative for fever and malaise/fatigue.  HENT: Negative for congestion, ear pain, sinus pain and sore throat.   Eyes: Negative for blurred vision, double vision, discharge and redness.  Respiratory: Negative for cough, hemoptysis, shortness of breath and wheezing.   Cardiovascular: Negative for chest pain.  Gastrointestinal: Positive for abdominal pain and constipation. Negative for blood in stool, diarrhea, nausea and vomiting.  Genitourinary: Negative for dysuria, flank pain and hematuria.  Musculoskeletal: Negative for myalgias.  Skin: Negative for rash.  Neurological: Negative for dizziness, weakness and headaches.  Psychiatric/Behavioral: Negative for depression and substance abuse.    Objective:   Vitals: BP 134/78 (BP Location: Left Arm)   Pulse 75    Temp 97.8 F (36.6 C) (Oral)   Resp 16   Wt 150 lb (68 kg)   LMP 03/28/2019   SpO2 100%   BMI 23.49 kg/m   Physical Exam Constitutional:      General: She is not in acute distress.    Appearance: Normal appearance. She is well-developed and normal weight. She is not ill-appearing, toxic-appearing or diaphoretic.  HENT:     Head: Normocephalic and atraumatic.     Right Ear: External ear normal.     Left Ear: External ear normal.     Nose: Nose normal.     Mouth/Throat:     Mouth: Mucous membranes are moist.     Pharynx: Oropharynx is clear.  Eyes:     General: No scleral icterus.    Extraocular Movements: Extraocular movements intact.     Pupils: Pupils are equal, round, and reactive to light.  Cardiovascular:     Rate and Rhythm: Normal rate and regular rhythm.     Pulses: Normal pulses.     Heart sounds: Normal heart sounds. No murmur. No friction rub. No gallop.   Pulmonary:     Effort: Pulmonary effort is normal. No respiratory distress.     Breath sounds: Normal breath sounds. No stridor. No wheezing, rhonchi or rales.  Abdominal:     General: Bowel sounds are normal. There is no distension.     Palpations: Abdomen is soft. There is no mass.     Tenderness: There is no abdominal tenderness. There is no  right CVA tenderness, left CVA tenderness, guarding or rebound.  Skin:    General: Skin is warm and dry.     Coloration: Skin is not pale.     Findings: No rash.  Neurological:     General: No focal deficit present.     Mental Status: She is alert and oriented to person, place, and time.  Psychiatric:        Mood and Affect: Mood normal.        Behavior: Behavior normal.        Thought Content: Thought content normal.        Judgment: Judgment normal.     Results for orders placed or performed during the hospital encounter of 03/31/19 (from the past 24 hour(s))  POCT urinalysis dip (device)     Status: None   Collection Time: 03/31/19  3:27 PM  Result Value  Ref Range   Glucose, UA NEGATIVE NEGATIVE mg/dL   Bilirubin Urine NEGATIVE NEGATIVE   Ketones, ur NEGATIVE NEGATIVE mg/dL   Specific Gravity, Urine 1.015 1.005 - 1.030   Hgb urine dipstick NEGATIVE NEGATIVE   pH 6.5 5.0 - 8.0   Protein, ur NEGATIVE NEGATIVE mg/dL   Urobilinogen, UA 0.2 0.0 - 1.0 mg/dL   Nitrite NEGATIVE NEGATIVE   Leukocytes,Ua NEGATIVE NEGATIVE  Pregnancy, urine POC     Status: None   Collection Time: 03/31/19  3:33 PM  Result Value Ref Range   Preg Test, Ur NEGATIVE NEGATIVE  POC urine preg, ED (not at [facility])     Status: None   Collection Time: 03/31/19  3:33 PM  Result Value Ref Range   Preg Test, Ur NEGATIVE NEGATIVE  POC urine preg, ED (not at [facility])     Status: None   Collection Time: 03/31/19  3:33 PM  Result Value Ref Range   Preg Test, Ur NEGATIVE NEGATIVE    Assessment and Plan :   1. Lower abdominal pain   2. Pelvic pain   3. History of ovarian cyst   4. Constipation, unspecified constipation type     Urine culture pending, patient requested blood test to confirm pregnancy given that she has had pregnancy test that came back positive at home.  Recommended dietary modifications for constipation.  If patient's beta hCG is negative, will prescribe naproxen for lower abdominal/pelvic pain related to what I suspect is a recurrent ovarian cyst.  Recommended patient establish care with a gynecologist.  Information given to patient. Counseled patient on potential for adverse effects with medications prescribed/recommended today, ER and return-to-clinic precautions discussed, patient verbalized understanding.    Jaynee Eagles, PA-C 03/31/19 1620

## 2019-04-02 LAB — URINE CULTURE: Culture: 200 — AB

## 2019-07-06 ENCOUNTER — Emergency Department (HOSPITAL_COMMUNITY)
Admission: EM | Admit: 2019-07-06 | Discharge: 2019-07-06 | Disposition: A | Discharge status: Home / Self Care | Payer: PRIVATE HEALTH INSURANCE | Attending: Emergency Medicine | Admitting: Emergency Medicine

## 2019-07-06 ENCOUNTER — Encounter (HOSPITAL_COMMUNITY): Payer: Self-pay | Admitting: Emergency Medicine

## 2019-07-06 DIAGNOSIS — H6690 Otitis media, unspecified, unspecified ear: Secondary | ICD-10-CM

## 2019-07-06 DIAGNOSIS — R519 Headache, unspecified: Secondary | ICD-10-CM | POA: Insufficient documentation

## 2019-07-06 DIAGNOSIS — J0100 Acute maxillary sinusitis, unspecified: Secondary | ICD-10-CM | POA: Diagnosis not present

## 2019-07-06 DIAGNOSIS — H9201 Otalgia, right ear: Secondary | ICD-10-CM | POA: Diagnosis present

## 2019-07-06 DIAGNOSIS — H6691 Otitis media, unspecified, right ear: Secondary | ICD-10-CM | POA: Diagnosis not present

## 2019-07-06 DIAGNOSIS — Z79899 Other long term (current) drug therapy: Secondary | ICD-10-CM | POA: Diagnosis not present

## 2019-07-06 MED ORDER — IBUPROFEN 600 MG PO TABS: 600.0000 mg | ORAL_TABLET | Freq: Four times a day (QID) | ORAL | 0 refills | Status: AC | PRN

## 2019-07-06 MED ORDER — AMOXICILLIN-POT CLAVULANATE 875-125 MG PO TABS: 1.0000 | ORAL_TABLET | Freq: Two times a day (BID) | ORAL | 0 refills | Status: AC

## 2019-07-06 NOTE — ED Notes (Signed)
Patient verbalizes understanding of discharge instructions. Opportunity for questioning and answers were provided. Armband removed by staff, pt discharged from ED.  

## 2019-07-06 NOTE — ED Triage Notes (Signed)
Pt states for two weeks she has had bilateral ear pain feels the right ear is worse and something feels there is water coming out of her ear. No event or injury leading up to this occurring. Pt is still able to hear, no dizziness.

## 2019-07-06 NOTE — ED Provider Notes (Signed)
[facility] EMERGENCY DEPARTMENT Provider Note   CSN: [id number] Arrival date & time: 07/06/19  1112     History Chief Complaint  Patient presents with  . Otalgia    Tracie Howard is a 24 y.o. female history of spring allergies presented to emergency department with congestion and pain in her right maxillary sinus as well as pain and drainage from her right ear.  She reports to have intermittent symptoms for about 2 weeks.  She has been having pain at different times in both of her ears, believes the right ear is worse.  Says the morning when she wakes up she feels like there is a serous fluid coming out of that ear.  She denies any loss of hearing.  She denies this to happen her before.  She does report some congestion in her right cheek.  She denies any fevers or chills.  She reports a mild headache.  She denies any other medical problems.  She reports she does have allergies to Zosyn but has had Augmentin before without complications.  HPI     Past Medical History:  Diagnosis Date  . Medical history non-contributory     There are no problems to display for this patient.   Past Surgical History:  Procedure Laterality Date  . APPENDECTOMY    . THERAPEUTIC ABORTION       OB History    Gravida  2   Para  1   Term  1   Preterm      AB  1   Living  1     SAB      TAB  1   Ectopic      Multiple      Live Births              Family History  Problem Relation Age of Onset  . Hypertension Mother   . Diabetes Father     Social History   Tobacco Use  . Smoking status: Never Smoker  . Smokeless tobacco: Never Used  Substance Use Topics  . Alcohol use: Never  . Drug use: Never    Home Medications Prior to Admission medications   Medication Sig Start Date End Date Taking? Authorizing Provider  amoxicillin-clavulanate (AUGMENTIN) 875-125 MG tablet Take 1 tablet by mouth every 12 (twelve) hours for 5 days. 07/06/19 07/11/19  Terald Sleeper, MD  ibuprofen (ADVIL) 600 MG tablet Take 1 tablet (600 mg total) by mouth every 6 (six) hours as needed for up to 30 doses. 07/06/19   Terald Sleeper, MD  Cetirizine HCl 10 MG CAPS Take 1 capsule (10 mg total) by mouth daily for 10 days. 11/14/18 02/08/19  Wieters, Hallie C, PA-C  fluticasone (FLONASE) 50 MCG/ACT nasal spray Place 1-2 sprays into both nostrils daily for 7 days. 11/14/18 02/08/19  Wieters, Hallie C, PA-C  loratadine (CLARITIN) 10 MG tablet Take 10 mg by mouth daily.  02/08/19  [provider]  sucralfate (CARAFATE) 1 g tablet Take 1 tablet (1 g total) by mouth 4 (four) times daily -  with meals and at bedtime for 21 days. 07/05/18 02/08/19  Cristina Gong, PA-C    Allergies    Zosyn [piperacillin sod-tazobactam so]  Review of Systems   Review of Systems  Constitutional: Negative for chills and fever.  HENT: Positive for congestion, ear discharge, ear pain, sinus pressure and sinus pain. Negative for facial swelling and sore throat.   Eyes: Negative for pain  and visual disturbance.  Respiratory: Negative for cough and shortness of breath.   Cardiovascular: Negative for chest pain and palpitations.  Gastrointestinal: Negative for abdominal pain and vomiting.  Musculoskeletal: Negative for arthralgias and back pain.  Skin: Negative for color change and rash.  Neurological: Positive for headaches. Negative for syncope.  Psychiatric/Behavioral: Negative for agitation and confusion.  All other systems reviewed and are negative.   Physical Exam Updated Vital Signs BP (!) 108/58 (BP Location: Right Arm)   Pulse 71   Temp 97.8 F (36.6 C) (Oral)   Resp 16   SpO2 100%   Physical Exam Vitals and nursing note reviewed.  Constitutional:      General: She is not in acute distress.    Appearance: She is well-developed.  HENT:     Head: Normocephalic and atraumatic.     Jaw: There is normal jaw occlusion.     Right Ear: Ear canal and external ear  normal. No decreased hearing noted. No drainage. A middle ear effusion is present. There is no impacted cerumen.     Left Ear: Tympanic membrane, ear canal and external ear normal. No decreased hearing noted. No drainage.  No middle ear effusion. There is no impacted cerumen.     Mouth/Throat:     Comments: Right maxillary ttp Eyes:     Extraocular Movements: Extraocular movements intact.     Conjunctiva/sclera: Conjunctivae normal.     Pupils: Pupils are equal, round, and reactive to light.  Cardiovascular:     Rate and Rhythm: Normal rate and regular rhythm.     Pulses: Normal pulses.  Pulmonary:     Effort: Pulmonary effort is normal. No respiratory distress.     Breath sounds: Normal breath sounds.  Musculoskeletal:     Cervical back: Neck supple.  Skin:    General: Skin is warm and dry.  Neurological:     General: No focal deficit present.     Mental Status: She is alert and oriented to person, place, and time. Mental status is at baseline.  Psychiatric:        Mood and Affect: Mood normal.        Behavior: Behavior normal.     ED Results / Procedures / Treatments   Labs (all labs ordered are listed, but only abnormal results are displayed) Labs Reviewed - No data to display  EKG None  Radiology No results found.  Procedures Procedures (including critical care time)  Medications Ordered in ED Medications - No data to display  ED Course  I have reviewed the triage vital signs and the nursing notes.  Pertinent labs & imaging results that were available during my care of the patient were reviewed by me and considered in my medical decision making (see chart for details).  24 yo female here with congestion, ear pain x 2 weeks, drainage from right ear, and maxillary tenderness.  Explained this is likely a viral syndrome but given the length of her symptoms now, I think a trial of antibiotics would be reasonable.  Will do augmentin.  Advised motrin as well.  Patient  agreeable.  Clinically well appearing Doubtful of meningitis, malignant otitis externa, mastoiditis, or inner ear infection.  No balance issues.    Final Clinical Impression(s) / ED Diagnoses Final diagnoses:  Acute otitis media, unspecified otitis media type  Acute maxillary sinusitis, recurrence not specified    Rx / DC Orders ED Discharge Orders         Ordered  amoxicillin-clavulanate (AUGMENTIN) 875-125 MG tablet  Every 12 hours    Note to Pharmacy: Patient has had augmentin without reaction in the past   07/06/19 1249    ibuprofen (ADVIL) 600 MG tablet  Every 6 hours PRN     07/06/19 1249           Wyvonnia Dusky, MD 07/06/19 1829

## 2019-08-12 ENCOUNTER — Emergency Department (HOSPITAL_COMMUNITY): Payer: PRIVATE HEALTH INSURANCE

## 2019-08-12 ENCOUNTER — Emergency Department (HOSPITAL_COMMUNITY)
Admission: EM | Admit: 2019-08-12 | Discharge: 2019-08-12 | Disposition: A | Discharge status: Home / Self Care | Payer: PRIVATE HEALTH INSURANCE | Attending: Emergency Medicine | Admitting: Emergency Medicine

## 2019-08-12 ENCOUNTER — Other Ambulatory Visit: Payer: Self-pay

## 2019-08-12 DIAGNOSIS — M545 Low back pain, unspecified: Secondary | ICD-10-CM

## 2019-08-12 DIAGNOSIS — Z79899 Other long term (current) drug therapy: Secondary | ICD-10-CM | POA: Diagnosis not present

## 2019-08-12 LAB — URINALYSIS, ROUTINE W REFLEX MICROSCOPIC
Bilirubin Urine: NEGATIVE
Glucose, UA: NEGATIVE mg/dL
Hgb urine dipstick: NEGATIVE
Ketones, ur: NEGATIVE mg/dL
Leukocytes,Ua: NEGATIVE
Nitrite: NEGATIVE
Protein, ur: NEGATIVE mg/dL
Specific Gravity, Urine: 1.012 (ref 1.005–1.030)
pH: 8 (ref 5.0–8.0)

## 2019-08-12 LAB — COMPREHENSIVE METABOLIC PANEL
ALT: 17 U/L (ref 0–44)
AST: 15 U/L (ref 15–41)
Albumin: 4 g/dL (ref 3.5–5.0)
Alkaline Phosphatase: 51 U/L (ref 38–126)
Anion gap: 7 (ref 5–15)
BUN: 8 mg/dL (ref 6–20)
CO2: 26 mmol/L (ref 22–32)
Calcium: 9.4 mg/dL (ref 8.9–10.3)
Chloride: 106 mmol/L (ref 98–111)
Creatinine, Ser: 0.75 mg/dL (ref 0.44–1.00)
GFR calc Af Amer: >60 mL/min (ref >60)
GFR calc non Af Amer: >60 mL/min (ref >60)
Glucose, Bld: 86 mg/dL (ref 70–99)
Potassium: 3.7 mmol/L (ref 3.5–5.1)
Sodium: 139 mmol/L (ref 135–145)
Total Bilirubin: 0.4 mg/dL (ref 0.3–1.2)
Total Protein: 7.4 g/dL (ref 6.5–8.1)

## 2019-08-12 LAB — CBC
HCT: 38.2 % (ref 36.0–46.0)
Hemoglobin: 12.9 g/dL (ref 12.0–15.0)
MCH: 32.7 pg (ref 26.0–34.0)
MCHC: 33.8 g/dL (ref 30.0–36.0)
MCV: 96.7 fL (ref 80.0–100.0)
Platelets: 229 10*3/uL (ref 150–400)
RBC: 3.95 MIL/uL (ref 3.87–5.11)
RDW: 11.4 % — ABNORMAL LOW (ref 11.5–15.5)
WBC: 6 10*3/uL (ref 4.0–10.5)
nRBC: 0 % (ref 0.0–0.2)

## 2019-08-12 LAB — I-STAT BETA HCG BLOOD, ED (MC, WL, AP ONLY): I-stat hCG, quantitative: <5 m[IU]/mL (ref <5)

## 2019-08-12 LAB — LIPASE, BLOOD: Lipase: 30 U/L (ref 11–51)

## 2019-08-12 MED ORDER — METHOCARBAMOL 500 MG PO TABS: 500.0000 mg | ORAL_TABLET | Freq: Three times a day (TID) | ORAL | 0 refills | Status: AC

## 2019-08-12 MED ORDER — SODIUM CHLORIDE 0.9% FLUSH: 3.0000 mL | Freq: Once | INTRAVENOUS | Status: DC

## 2019-08-12 MED ORDER — NAPROXEN 500 MG PO TABS: 500.0000 mg | ORAL_TABLET | Freq: Three times a day (TID) | ORAL | 0 refills | Status: AC

## 2019-08-12 MED ORDER — METHOCARBAMOL 500 MG PO TABS
1000.0000 mg | ORAL_TABLET | Freq: Once | ORAL | Status: AC
  Administered 2019-08-12: 1000 mg via ORAL
  Filled 2019-08-12: qty 2

## 2019-08-12 MED ORDER — ACETAMINOPHEN 500 MG PO TABS
1000.0000 mg | ORAL_TABLET | Freq: Once | ORAL | Status: AC
  Administered 2019-08-12: 1000 mg via ORAL
  Filled 2019-08-12: qty 2

## 2019-08-12 MED ORDER — KETOROLAC TROMETHAMINE 30 MG/ML IJ SOLN
30.0000 mg | Freq: Once | INTRAMUSCULAR | Status: AC
  Administered 2019-08-12: 30 mg via INTRAMUSCULAR
  Filled 2019-08-12: qty 1

## 2019-08-12 NOTE — ED Triage Notes (Signed)
Pt asleep in triage room, but on wakening reports lower back pain with mild pelvic pain onset this morning. Reports two days of generalized weakness.

## 2019-08-12 NOTE — ED Notes (Signed)
Patient verbalizes understanding of discharge instructions. Opportunity for questioning and answers were provided. Armband removed by staff, pt discharged from ED.  

## 2019-08-12 NOTE — ED Provider Notes (Signed)
[facility] EMERGENCY DEPARTMENT Provider Note   CSN: [id number] Arrival date & time: 08/12/19  1457     History Chief Complaint  Patient presents with  . Back Pain    Tracie Howard is a 24 y.o. female presents to ER for evaluation of back pain. Onset 3 days ago. Described as constant but worse with changing positions, bending at the waist.  Pain is across her lower back.  States ever since giving birth in 2017 she has had intermittent low back pain but this is worse.  She took ibuprofen last night and it did not help.  Associated with fatigue and fever of 102 last night.  States the fever has not come back today.  Denies falls, trauma, heavy exertional activity or lifting.  Denies IVUD. Denies saddle anesthesia, numbness or weakness in extremities.  No weakness but states lifting legs and walking makes the back pain worse.  History of irregular periods, last 2 months ago.  No nausea, vomiting, diarrhea, constipation. No abnormal vaginal discharge or bleeding. No urinary symptoms.  HPI     Past Medical History:  Diagnosis Date  . Medical history non-contributory     There are no problems to display for this patient.   Past Surgical History:  Procedure Laterality Date  . APPENDECTOMY    . THERAPEUTIC ABORTION       OB History    Gravida  2   Para  1   Term  1   Preterm      AB  1   Living  1     SAB      TAB  1   Ectopic      Multiple      Live Births              Family History  Problem Relation Age of Onset  . Hypertension Mother   . Diabetes Father     Social History   Tobacco Use  . Smoking status: Never Smoker  . Smokeless tobacco: Never Used  Substance Use Topics  . Alcohol use: Never  . Drug use: Never    Home Medications Prior to Admission medications   Medication Sig Start Date End Date Taking? Authorizing Provider  ibuprofen (ADVIL) 600 MG tablet Take 1 tablet (600 mg total) by mouth every 6 (six) hours as  needed for up to 30 doses. 07/06/19   Terald Sleeper, MD  methocarbamol (ROBAXIN) 500 MG tablet Take 1 tablet (500 mg total) by mouth 3 (three) times daily. 08/12/19   Liberty Handy, PA-C  naproxen (NAPROSYN) 500 MG tablet Take 1 tablet (500 mg total) by mouth 3 (three) times daily with meals. 08/12/19   Liberty Handy, PA-C  Cetirizine HCl 10 MG CAPS Take 1 capsule (10 mg total) by mouth daily for 10 days. 11/14/18 02/08/19  Wieters, Hallie C, PA-C  fluticasone (FLONASE) 50 MCG/ACT nasal spray Place 1-2 sprays into both nostrils daily for 7 days. 11/14/18 02/08/19  Wieters, Hallie C, PA-C  loratadine (CLARITIN) 10 MG tablet Take 10 mg by mouth daily.  02/08/19  [provider]  sucralfate (CARAFATE) 1 g tablet Take 1 tablet (1 g total) by mouth 4 (four) times daily -  with meals and at bedtime for 21 days. 07/05/18 02/08/19  Cristina Gong, PA-C    Allergies    Zosyn [piperacillin sod-tazobactam so]  Review of Systems   Review of Systems  Constitutional: Positive for fatigue and fever.  Musculoskeletal: Positive for back pain.  All other systems reviewed and are negative.   Physical Exam Updated Vital Signs BP 124/76   Pulse 77   Temp 98 F (36.7 C)   Resp 17   Ht 5\' 6"  (1.676 m)   Wt 67.6 kg   SpO2 100%   BMI 24.05 kg/m   Physical Exam Vitals and nursing note reviewed.  Constitutional:      Appearance: She is well-developed.     Comments: Non toxic in NAD  HENT:     Head: Normocephalic and atraumatic.     Nose: Nose normal.  Eyes:     Conjunctiva/sclera: Conjunctivae normal.  Cardiovascular:     Rate and Rhythm: Normal rate and regular rhythm.     Comments: Dp and PT pulses intact bilaterally  Pulmonary:     Effort: Pulmonary effort is normal.     Breath sounds: Normal breath sounds.  Abdominal:     General: Bowel sounds are normal.     Palpations: Abdomen is soft.     Tenderness: There is no abdominal tenderness.     Comments: No G/R/R. No  suprapubic or CVA tenderness. Negative Murphy's and McBurney's. Active BS to lower quadrants.   Musculoskeletal:        General: Tenderness present. Normal range of motion.     Cervical back: Normal range of motion.     Comments: Diffuse tenderness over skin of entire back, patient moves and squirms with light graze of the skin.  Also reports tenderness throughout entire midline and paraspinal TL spine.  Pain reproduced with sitting up in bed, position changes.  No SI or sciatic notch tenderness.   Skin:    General: Skin is warm and dry.     Capillary Refill: Capillary refill takes less than 2 seconds.  Neurological:     Mental Status: She is alert.     Comments: Sensation in upper/lower extremities intact.  Symmetric strength in bilateral upper/lower extremities including ankle flexion/extension, hip abduction/adduction, knee flexion/extension.    Psychiatric:        Behavior: Behavior normal.     ED Results / Procedures / Treatments   Labs (all labs ordered are listed, but only abnormal results are displayed) Labs Reviewed  CBC - Abnormal; Notable for the following components:      Result Value   RDW 11.4 (*)    All other components within normal limits  URINALYSIS, ROUTINE W REFLEX MICROSCOPIC - Abnormal; Notable for the following components:   Color, Urine STRAW (*)    All other components within normal limits  LIPASE, BLOOD  COMPREHENSIVE METABOLIC PANEL  I-STAT BETA HCG BLOOD, ED ([facility], [facility], [facility] ONLY)    EKG None  Radiology CT Renal Stone Study  Result Date: 08/12/2019 CLINICAL DATA:  Lower back pain and pelvic pain. EXAM: CT ABDOMEN AND PELVIS WITHOUT CONTRAST TECHNIQUE: Multidetector CT imaging of the abdomen and pelvis was performed following the standard protocol without IV contrast. COMPARISON:  Pelvic ultrasound dated 02/08/2019 FINDINGS: Lower chest: No acute abnormality. Hepatobiliary: No focal liver abnormality is seen. No gallstones, gallbladder wall thickening, or  biliary dilatation. Pancreas: Unremarkable. No pancreatic ductal dilatation or surrounding inflammatory changes. Spleen: Normal in size without focal abnormality. Adrenals/Urinary Tract: Adrenal glands are unremarkable. Kidneys are normal, without renal calculi, focal lesion, or hydronephrosis. Bladder is unremarkable. Stomach/Bowel: Stomach is within normal limits. No pericecal inflammatory changes are noted to suggest acute appendicitis. No evidence of bowel wall thickening, distention, or inflammatory changes.  Vascular/Lymphatic: No significant vascular findings are present. No enlarged abdominal or pelvic lymph nodes. Reproductive: Uterus and bilateral adnexa are unremarkable. Other: No abdominal wall hernia or abnormality. No abdominopelvic ascites. Musculoskeletal: No acute or significant osseous findings. IMPRESSION: No acute process in the abdomen or pelvis. Electronically Signed   By: Romona Curls M.D.   On: 08/12/2019 20:49    Procedures Procedures (including critical care time)  Medications Ordered in ED Medications  sodium chloride flush (NS) 0.9 % injection 3 mL (has no administration in time range)  ketorolac (TORADOL) 30 MG/ML injection 30 mg (30 mg Intramuscular Given 08/12/19 1911)  methocarbamol (ROBAXIN) tablet 1,000 mg (1,000 mg Oral Given 08/12/19 1912)  acetaminophen (TYLENOL) tablet 1,000 mg (1,000 mg Oral Given 08/12/19 1912)    ED Course  I have reviewed the triage vital signs and the nursing notes.  Pertinent labs & imaging results that were available during my care of the patient were reviewed by me and considered in my medical decision making (see chart for details).    MDM Rules/Calculators/A&P                      24 year old female with low back pain.  Reports having intermittent low back pain since 2017.  Atraumatic.  On exam she has tenderness along the skin on her back and diffuse midline and paraspinal areas of her thoracic and lumbar spine.  Exam is  otherwise reassuring.  Afebrile.  No distal neuro or pulse deficits.  No abdominal tenderness.  No flank tenderness.  ER work-up initiated in triage is vastly reassuring.  I have personally reviewed and interpreted these.  Normal CBC, CMP, lipase.  UA is negative for infection.  No hematuria.  CT renal obtained given her flank tenderness.  CT renal is negative for any musculoskeletal acute abnormalities, renal stones.  Patient is given Tylenol, Robaxin, Toradol and states pain has improved.  Repeat MSK exam reveals improved back tenderness.  She is not able to move her lower extremities with less pain.  No neuro pulse deficits.  High suspicion for MSK etiology of pain.  I have considered emergent cause like cauda equina, epidural abscess or discitis, AAA or dissection but these are highly unlikely given her exam, will labs and imaging results.  Recommended symptomatic management of likely MSK related pain.  Return precautions discussed.  Patient comfortable with this plan.  Final Clinical Impression(s) / ED Diagnoses Final diagnoses:  Acute midline low back pain without sciatica    Rx / DC Orders ED Discharge Orders         Ordered    naproxen (NAPROSYN) 500 MG tablet  3 times daily with meals     08/12/19 2137    methocarbamol (ROBAXIN) 500 MG tablet  3 times daily     08/12/19 2137           Liberty Handy, PA-C 08/12/19 2200    Rolan Bucco, MD 08/12/19 2324

## 2019-08-12 NOTE — Discharge Instructions (Addendum)
You were seen in the ER for back pain  Labs, urinalysis, CT were normal  I suspect your pain is from a muscular strain or soft tissue injury  Take naproxen every 8 hours for pain.  For more pain control you can take [phone] mg acetaminophen every 8 hours.  Use methocarbamol for muscle spasms and tightness  Return for fever greater than 100.58F, vomiting, diarrhea, constipation, severe abdominal pain, changes in urine, weakness or numbness in extremities, numbness in your groin or loss of bladder or bowel control

## 2019-12-20 ENCOUNTER — Other Ambulatory Visit: Payer: Self-pay

## 2019-12-20 ENCOUNTER — Ambulatory Visit (HOSPITAL_COMMUNITY)
Admission: EM | Admit: 2019-12-20 | Discharge: 2019-12-20 | Discharge status: Against Medical Advice | Payer: PRIVATE HEALTH INSURANCE | Attending: Family Medicine | Admitting: Family Medicine

## 2019-12-20 ENCOUNTER — Encounter (HOSPITAL_COMMUNITY): Payer: Self-pay | Admitting: *Deleted

## 2019-12-20 DIAGNOSIS — Z20822 Contact with and (suspected) exposure to covid-19: Secondary | ICD-10-CM | POA: Diagnosis not present

## 2019-12-20 NOTE — ED Notes (Signed)
Pt called in lobby 2 xs with no answer. Called pt on phone number list on chart with no answer, unable to lvm.

## 2019-12-20 NOTE — ED Triage Notes (Addendum)
Patient in with left sided chest pain that radiates under left breast x 2 days. Patient states she has loss appetite and has been feeling tired x 2 weeks. Patient states she experiences SOB with chest pain. Patient has taken Tylenol at home.

## 2019-12-21 LAB — SARS CORONAVIRUS 2 (TAT 6-24 HRS): SARS Coronavirus 2: NEGATIVE

## 2020-04-21 IMAGING — DX PORTABLE CHEST - 1 VIEW
1 series · 1 of 1 positions shown · non-contrast
Comparison: Chest x-ray dated 03/03/2018.

CLINICAL DATA: Heartburn and shortness of breath while eating for 3
days.

EXAM:
PORTABLE CHEST 1 VIEW

[chest]
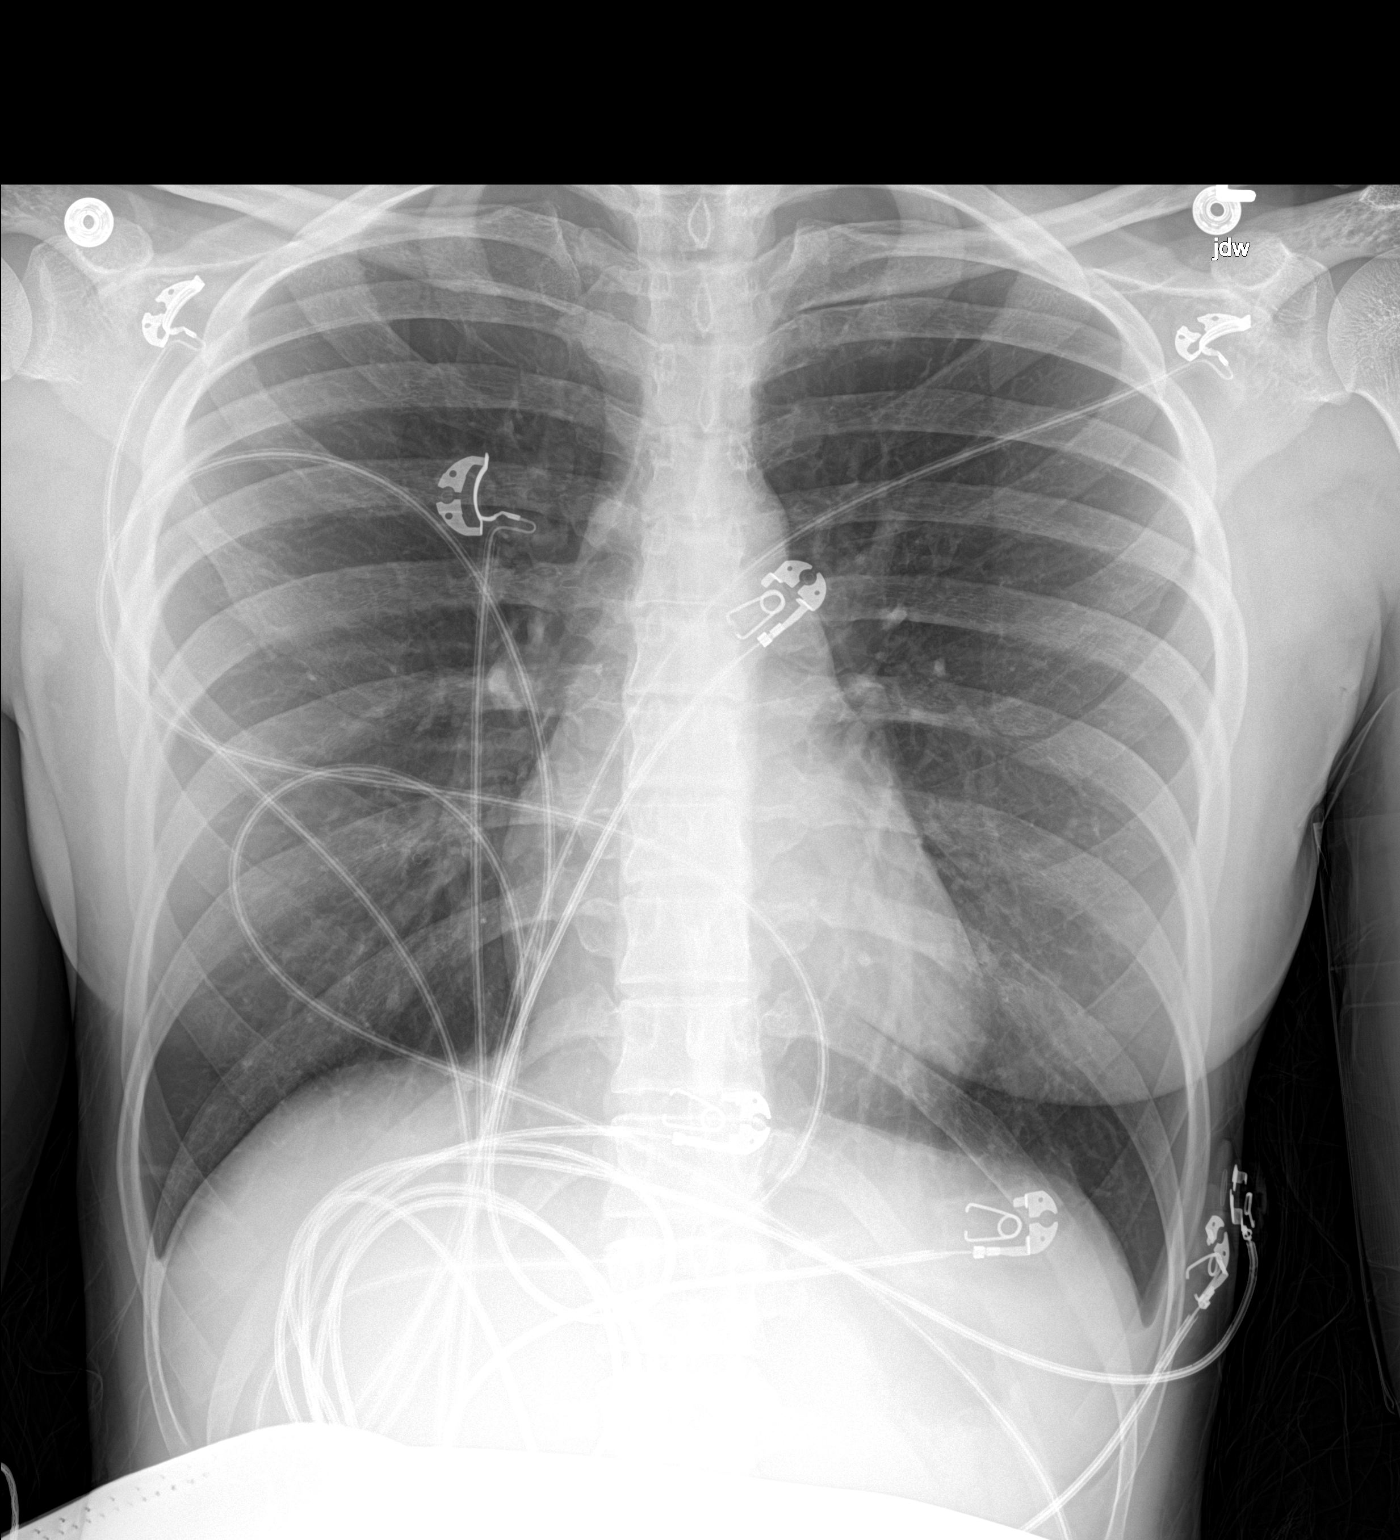

[1 of 1 positions shown; findings below may reference images not displayed]

FINDINGS: The heart size and mediastinal contours are within normal limits.
Both lungs are clear. The visualized skeletal structures are
unremarkable.
IMPRESSION: Normal chest x-ray.

## 2020-06-29 ENCOUNTER — Emergency Department: Admit: 2020-06-29 | Discharge status: Absent without leave | Payer: Self-pay

## 2020-06-29 ENCOUNTER — Telehealth: Payer: Self-pay | Admitting: Emergency Medicine

## 2020-06-29 ENCOUNTER — Emergency Department
Admission: EM | Admit: 2020-06-29 | Discharge: 2020-06-29 | Disposition: A | Discharge status: Home / Self Care | Payer: PRIVATE HEALTH INSURANCE | Source: Home / Self Care | Attending: Emergency Medicine | Admitting: Emergency Medicine

## 2020-06-29 DIAGNOSIS — R5383 Other fatigue: Secondary | ICD-10-CM

## 2020-06-29 LAB — POCT URINE PREGNANCY: Preg Test, Ur: NEGATIVE

## 2020-06-29 LAB — POCT MONO SCREEN (KUC): Mono, POC: NEGATIVE

## 2020-06-29 NOTE — ED Notes (Signed)
STAT labs picked up at 110pm

## 2020-06-29 NOTE — ED Provider Notes (Signed)
HPI  SUBJECTIVE:  Tracie Howard is a 25 y.o. female who presents with daily, constant fatigue, shortness of breath and worsening fatigue with exertion for the past 4 weeks.  She reports decreased appetite.  She had normal menses last month but states that it is heavier this month.  No preceding viral illness, preceding sore throat, unintentional weight loss.  She reports weight gain.  No fevers, melena, hematochezia, hematuria, coughing, wheezing.  No recent travel.  No nausea, vomiting, cold intolerance.  She states that she is hot when other people are cold.  She states that she had an episode where a large chunk of her hair fell out 2 months ago, but has since grown back.  No skin changes.  No abdominal pain, constipation.  She is not craving ice.  No history of blood transfusions, IVDU.  She is in a long-term monogamous relationship with a female partner who recently tested negative for H IV.  She tried Tylenol PM twice.  No alleviating factors.  Symptoms are worse with daily activities. PMH of COVID Oct 2020.  Past medical history negative for diabetes, hypertension, thyroid disease, cancer, chronic kidney disease, liver disease, anemia, autoimmune disease.  LMP: Now.  PMD: None.   Past Medical History:  Diagnosis Date  . Medical history non-contributory     Past Surgical History:  Procedure Laterality Date  . APPENDECTOMY    . THERAPEUTIC ABORTION      Family History  Problem Relation Age of Onset  . Hypertension Mother   . Diabetes Father     Social History   Tobacco Use  . Smoking status: Never Smoker  . Smokeless tobacco: Never Used  Vaping Use  . Vaping Use: Never used  Substance Use Topics  . Alcohol use: Never  . Drug use: Never    No current facility-administered medications for this encounter.  Current Outpatient Medications:  .  ibuprofen (ADVIL) 600 MG tablet, Take 1 tablet (600 mg total) by mouth every 6 (six) hours as needed for up to 30 doses., Disp: 30  tablet, Rfl: 0 .  methocarbamol (ROBAXIN) 500 MG tablet, Take 1 tablet (500 mg total) by mouth 3 (three) times daily., Disp: 20 tablet, Rfl: 0 .  naproxen (NAPROSYN) 500 MG tablet, Take 1 tablet (500 mg total) by mouth 3 (three) times daily with meals., Disp: 30 tablet, Rfl: 0  Allergies  Allergen Reactions  . Zosyn [Piperacillin Sod-Tazobactam So] Rash    Has tolerated keflex     ROS  As noted in HPI.   Physical Exam  BP 109/69 (BP Location: Left Arm)   Pulse 81   Temp 98.6 F (37 C) (Oral)   Resp 17   LMP 06/27/2020   SpO2 100%   Constitutional: Well developed, well nourished, no acute distress Eyes: PERRL, EOMI, conjunctiva normal bilaterally. HENT: Normocephalic, atraumatic,mucus membranes moist Neck: Some anterior neck fullness, no goiter.  No thyroid tenderness. Respiratory: Clear to auscultation bilaterally, no rales, no wheezing, no rhonchi Cardiovascular: Normal rate and rhythm, no murmurs, no gallops, no rubs GI: Soft, nondistended, normal bowel sounds, nontender, no rebound, no guarding.  No splenomegaly Lymph: No cervical, supraclavicular lymphadenopathy skin: No rash, skin intact Musculoskeletal: No edema, no tenderness, no deformities Neurologic: Alert & oriented x 3, CN III-XII grossly intact, no motor deficits, sensation grossly intact Psychiatric: Speech and behavior appropriate   ED Course   Medications - No data to display  Orders Placed This Encounter  Procedures  . COMPLETE METABOLIC PANEL WITH  GFR    Standing Status:   Standing    Number of Occurrences:   1  . TSH    Standing Status:   Standing    Number of Occurrences:   1  . HIV Antibody (routine testing w rflx)    Standing Status:   Standing    Number of Occurrences:   1  . CBC with Differential    Standing Status:   Standing    Number of Occurrences:   1  . POCT mono screen    Standing Status:   Standing    Number of Occurrences:   1  . POCT urine pregnancy    Standing Status:    Standing    Number of Occurrences:   1  . POCT urine pregnancy    Standing Status:   Standing    Number of Occurrences:   1   Results for orders placed or performed during the hospital encounter of 06/29/20 (from the past 24 hour(s))  POCT mono screen     Status: None   Collection Time: 06/29/20 10:52 AM  Result Value Ref Range   Mono, POC Negative Negative  POCT urine pregnancy     Status: None   Collection Time: 06/29/20 11:27 AM  Result Value Ref Range   Preg Test, Ur Negative Negative  COMPLETE METABOLIC PANEL WITH GFR     Status: Abnormal   Collection Time: 06/29/20 11:43 AM  Result Value Ref Range   Glucose, Bld 88 65 - 99 mg/dL   BUN 11 7 - 25 mg/dL   Creat 1.61 0.96 - 0.45 mg/dL   GFR, Est Non African American 95 > OR = 60 mL/min/1.20m2   GFR, Est African American 110 > OR = 60 mL/min/1.25m2   BUN/Creatinine Ratio NOT APPLICABLE 6 - 22 (calc)   Sodium 137 135 - 146 mmol/L   Potassium 4.2 3.5 - 5.3 mmol/L   Chloride 102 98 - 110 mmol/L   CO2 28 20 - 32 mmol/L   Calcium 10.7 (H) 8.6 - 10.2 mg/dL   Total Protein 8.3 (H) 6.1 - 8.1 g/dL   Albumin 4.9 3.6 - 5.1 g/dL   Globulin 3.4 1.9 - 3.7 g/dL (calc)   AG Ratio 1.4 1.0 - 2.5 (calc)   Total Bilirubin 0.6 0.2 - 1.2 mg/dL   Alkaline phosphatase (APISO) 57 31 - 125 U/L   AST 15 10 - 30 U/L   ALT 20 6 - 29 U/L   No results found.  ED Clinical Impression  1. Fatigue, unspecified type      ED Assessment/Plan   Patient does not have mono.  She is not pregnant.  She has fatigue of unknown etiology.  Will initiate work-up with CBC, TSH, CMP.  We discussed doing an HIV, while she has no risk factors, she would like to check anyway.  Will have her follow-up ASAP with a primary care provider here at [facility].  will place an order for assistance in finding a PMD.  CBC is normal.  Mildly elevated calcium, but I do not think that it is clinically significant.  TSH normal.  HIV pending, but I suspect that it  will be negative.  Discussed these with patient.  She will need to follow-up with primary care for further work-up.  Discussed labs,  MDM, treatment plan, and plan for follow-up with patient  patient agrees with plan.   No orders of the defined types were placed in this encounter.     *  This clinic note was created using Scientist, clinical (histocompatibility and immunogenetics). Therefore, there may be occasional mistakes despite careful proofreading. ?    Domenick Gong, MD 06/29/20 1429

## 2020-06-29 NOTE — Discharge Instructions (Addendum)
Your labs will actually be back later on this afternoon.  I will contact you if any of them come back abnormal.  If you do not hear from me by tomorrow, then all of your labs are normal.  You can also call here and get the results please get my chart so that you can get the results as well.  Your mono was negative.  You are not pregnant.  You will need to follow-up with a primary care provider here at [facility] ASAP for further evaluation and management

## 2020-06-29 NOTE — Telephone Encounter (Signed)
Stat lab pick up at 1248- confirmation # [id number] - awaiting pick up

## 2020-06-29 NOTE — ED Triage Notes (Addendum)
Pt c/o fatigue/exhaustion x 3 weeks. Gets about 7-8 hours of sleep per night. No known hx of mono.

## 2020-06-30 LAB — COMPLETE METABOLIC PANEL WITH GFR
AG Ratio: 1.4 (calc) (ref 1.0–2.5)
ALT: 20 U/L (ref 6–29)
AST: 15 U/L (ref 10–30)
Albumin: 4.9 g/dL (ref 3.6–5.1)
Alkaline phosphatase (APISO): 57 U/L (ref 31–125)
BUN: 11 mg/dL (ref 7–25)
CO2: 28 mmol/L (ref 20–32)
Calcium: 10.7 mg/dL — ABNORMAL HIGH (ref 8.6–10.2)
Chloride: 102 mmol/L (ref 98–110)
Creat: 0.84 mg/dL (ref 0.50–1.10)
GFR, Est African American: 110 mL/min/{1.73_m2} (ref >=60)
GFR, Est Non African American: 95 mL/min/{1.73_m2} (ref >=60)
Globulin: 3.4 g/dL (calc) (ref 1.9–3.7)
Glucose, Bld: 88 mg/dL (ref 65–99)
Potassium: 4.2 mmol/L (ref 3.5–5.3)
Sodium: 137 mmol/L (ref 135–146)
Total Bilirubin: 0.6 mg/dL (ref 0.2–1.2)
Total Protein: 8.3 g/dL — ABNORMAL HIGH (ref 6.1–8.1)

## 2020-06-30 LAB — CBC WITH DIFFERENTIAL/PLATELET
Absolute Monocytes: 354 cells/uL (ref 200–950)
Basophils Absolute: 29 cells/uL (ref 0–200)
Basophils Relative: 0.5 %
Eosinophils Absolute: 70 cells/uL (ref 15–500)
Eosinophils Relative: 1.2 %
HCT: 44.5 % (ref 35.0–45.0)
Hemoglobin: 14.7 g/dL (ref 11.7–15.5)
Lymphs Abs: 1641 cells/uL (ref 850–3900)
MCH: 31.5 pg (ref 27.0–33.0)
MCHC: 33 g/dL (ref 32.0–36.0)
MCV: 95.3 fL (ref 80.0–100.0)
MPV: 10 fL (ref 7.5–12.5)
Monocytes Relative: 6.1 %
Neutro Abs: 3706 cells/uL (ref 1500–7800)
Neutrophils Relative %: 63.9 %
Platelets: 238 10*3/uL (ref 140–400)
RBC: 4.67 10*6/uL (ref 3.80–5.10)
RDW: 11.8 % (ref 11.0–15.0)
Total Lymphocyte: 28.3 %
WBC: 5.8 10*3/uL (ref 3.8–10.8)

## 2020-06-30 LAB — TSH: TSH: 0.52 mIU/L

## 2020-06-30 LAB — HIV ANTIBODY (ROUTINE TESTING W REFLEX): HIV 1&2 Ab, 4th Generation: NONREACTIVE

## 2020-08-05 ENCOUNTER — Other Ambulatory Visit: Payer: Self-pay

## 2020-08-05 ENCOUNTER — Encounter (HOSPITAL_BASED_OUTPATIENT_CLINIC_OR_DEPARTMENT_OTHER): Payer: Self-pay | Admitting: Emergency Medicine

## 2020-08-05 DIAGNOSIS — R42 Dizziness and giddiness: Secondary | ICD-10-CM | POA: Insufficient documentation

## 2020-08-05 DIAGNOSIS — Z5321 Procedure and treatment not carried out due to patient leaving prior to being seen by health care provider: Secondary | ICD-10-CM | POA: Insufficient documentation

## 2020-08-05 LAB — PREGNANCY, URINE: Preg Test, Ur: NEGATIVE

## 2020-08-05 NOTE — ED Triage Notes (Signed)
Reports started feeling dizzy since 3pm.  Reports she feels sob when she talks and walks.  Reports the dizziness comes and goes.  No neuro deficits in triage.  Denies any nausea.  Does reports using nuvoring recently for birth control.  Took it out two weeks ago due to abdominal pain, itching, headache.

## 2020-08-06 ENCOUNTER — Emergency Department (HOSPITAL_BASED_OUTPATIENT_CLINIC_OR_DEPARTMENT_OTHER)
Admission: EM | Admit: 2020-08-06 | Discharge: 2020-08-06 | Disposition: A | Discharge status: Home / Self Care | Payer: Self-pay | Attending: Emergency Medicine | Admitting: Emergency Medicine

## 2020-08-07 ENCOUNTER — Other Ambulatory Visit: Payer: Self-pay

## 2020-08-07 ENCOUNTER — Emergency Department (HOSPITAL_BASED_OUTPATIENT_CLINIC_OR_DEPARTMENT_OTHER)
Admission: EM | Admit: 2020-08-07 | Discharge: 2020-08-07 | Disposition: A | Discharge status: Home / Self Care | Payer: Self-pay | Attending: Emergency Medicine | Admitting: Emergency Medicine

## 2020-08-07 ENCOUNTER — Emergency Department (HOSPITAL_BASED_OUTPATIENT_CLINIC_OR_DEPARTMENT_OTHER): Payer: Self-pay

## 2020-08-07 ENCOUNTER — Encounter (HOSPITAL_BASED_OUTPATIENT_CLINIC_OR_DEPARTMENT_OTHER): Payer: Self-pay | Admitting: *Deleted

## 2020-08-07 DIAGNOSIS — Z5321 Procedure and treatment not carried out due to patient leaving prior to being seen by health care provider: Secondary | ICD-10-CM | POA: Insufficient documentation

## 2020-08-07 DIAGNOSIS — R519 Headache, unspecified: Secondary | ICD-10-CM | POA: Insufficient documentation

## 2020-08-07 NOTE — ED Notes (Signed)
Pt refused CT head. Told registration she just wants to leave.
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
## 2020-08-07 NOTE — ED Notes (Signed)
Pt walked to the rest room. She was found by bystander laying prone on the bathroom floor. Eye fluttering.She was assisted to a wheelchair. Charge nurse aware. Will advise MD of medical screening need.

## 2020-11-25 IMAGING — US US PELVIS COMPLETE
1 series · 13 of 25 positions shown · non-contrast
Comparison: 05/16/2018

CLINICAL DATA: Pelvic pain

EXAM:
TRANSABDOMINAL AND TRANSVAGINAL ULTRASOUND OF PELVIS
DOPPLER ULTRASOUND OF OVARIES
TECHNIQUE: Both transabdominal and transvaginal ultrasound examinations of the
pelvis were performed. Transabdominal technique was performed for
global imaging of the pelvis including uterus, ovaries, adnexal
regions, and pelvic cul-de-sac.
It was necessary to proceed with endovaginal exam following the
transabdominal exam to visualize the uterus endometrium ovaries.
Color and duplex Doppler ultrasound was utilized to evaluate blood
flow to the ovaries.

[Series 1: us pelvis complete · 13 of 78 slices shown]
[im 1/78]
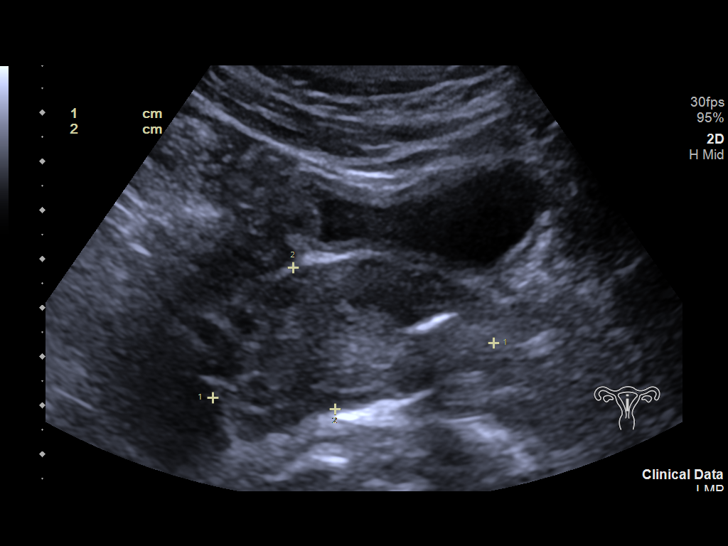
[im 7/78]
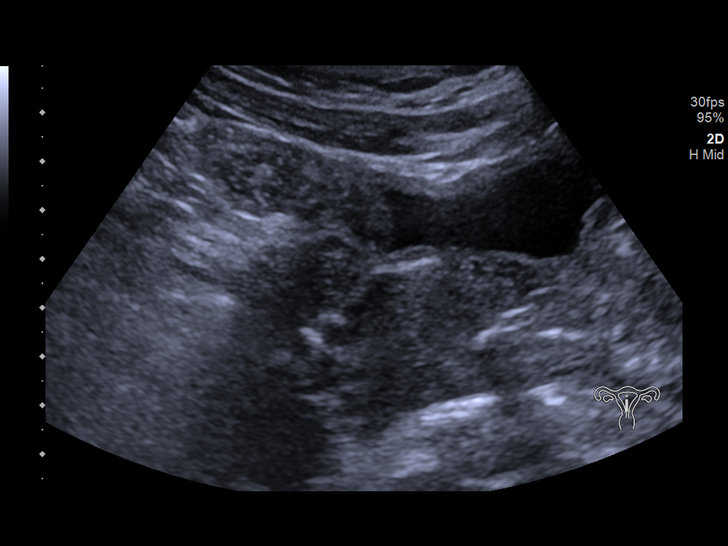
[im 13/78]
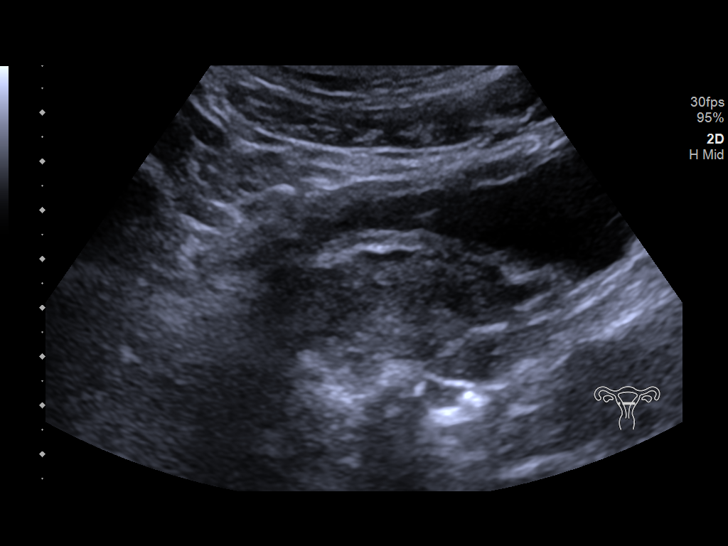
[im 20/78]
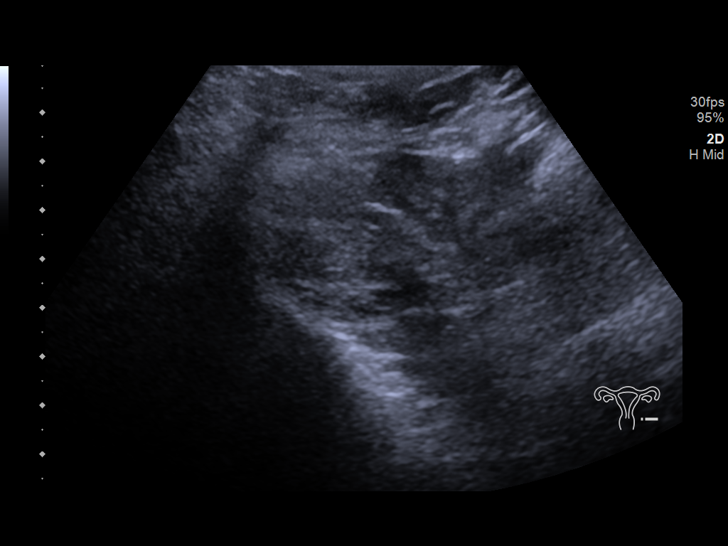
[im 26/78]
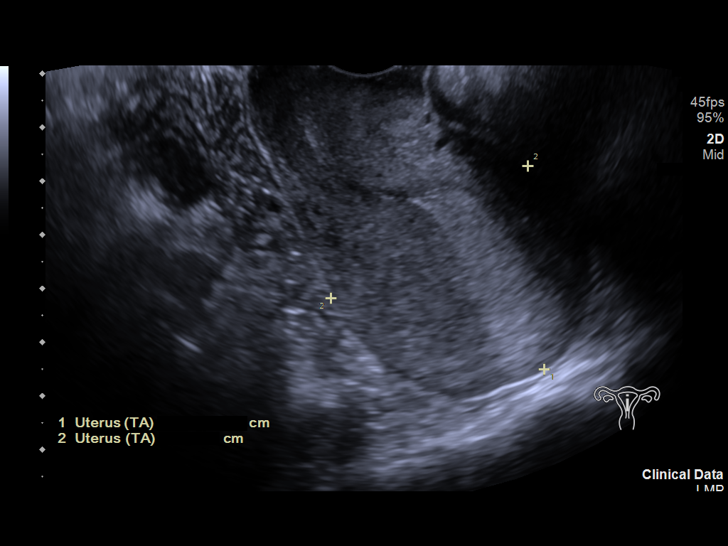
[im 33/78]
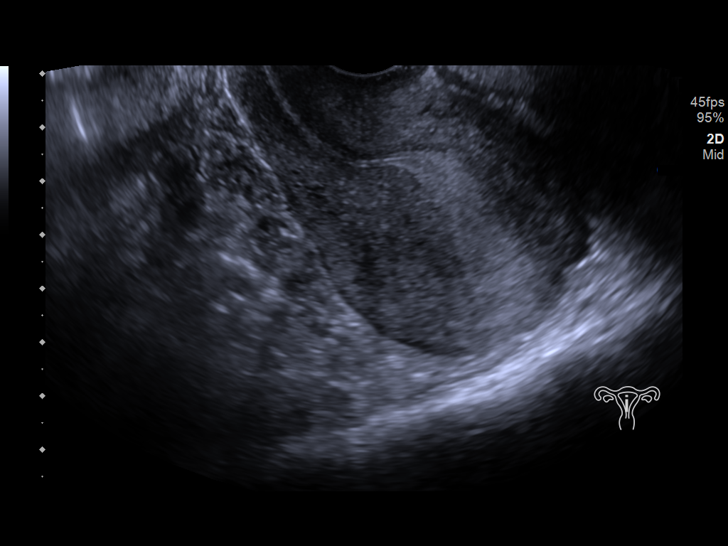
[im 39/78]
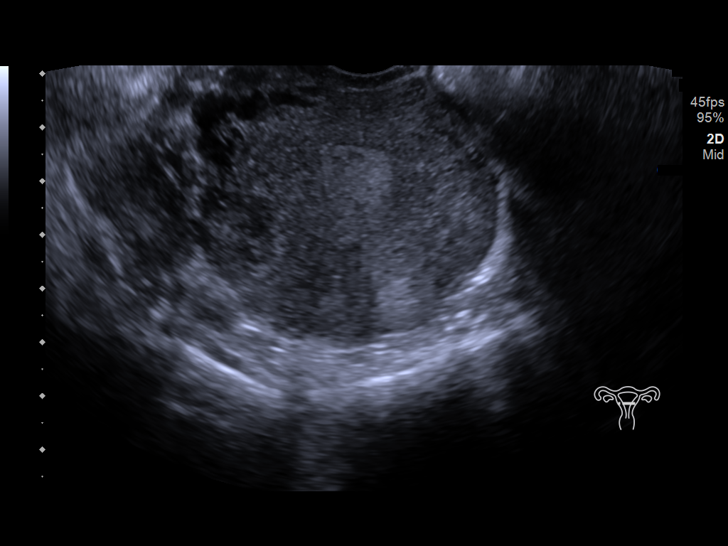
[im 45/78]
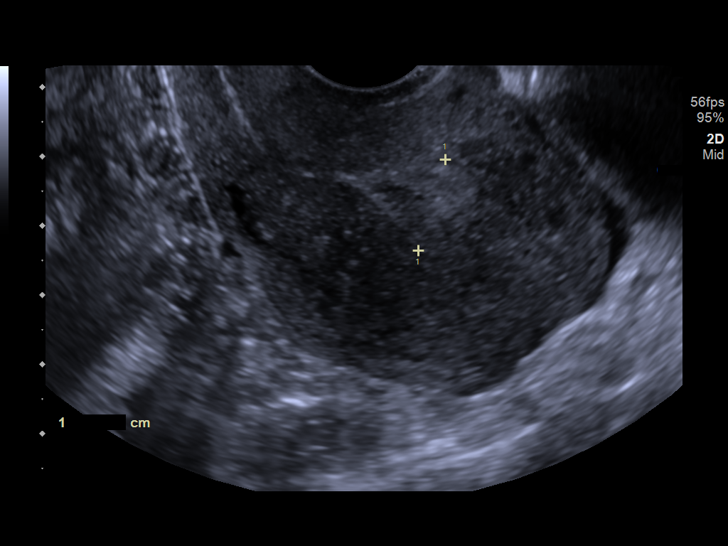
[im 52/78]
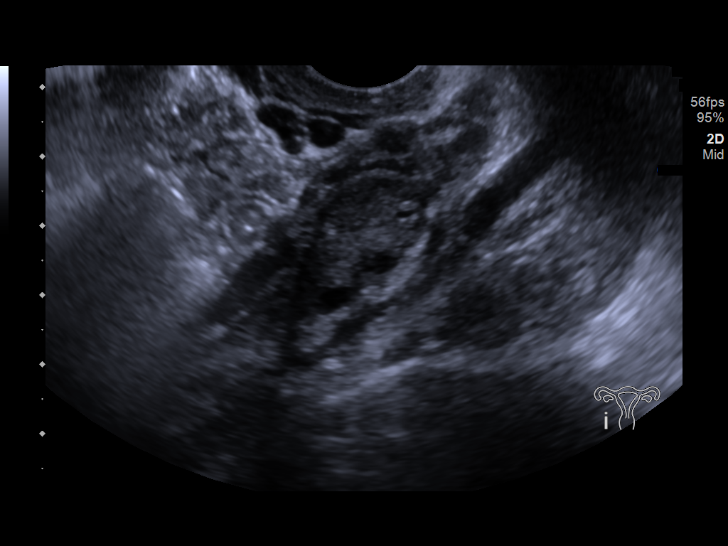
[im 58/78]
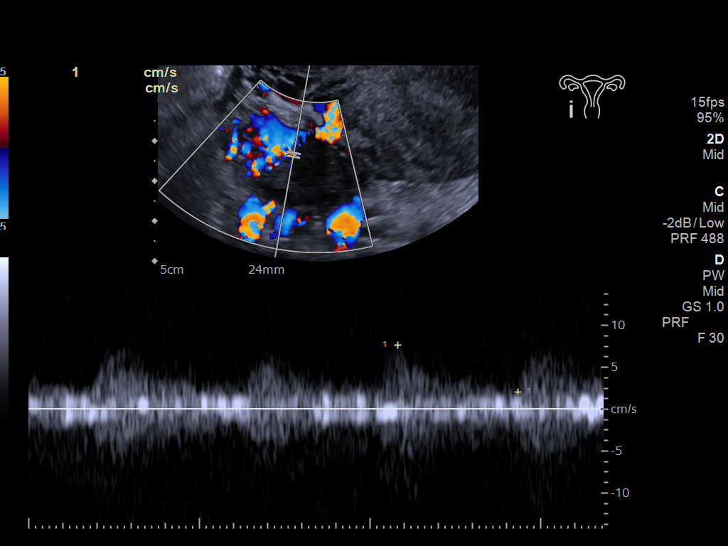
[im 65/78]
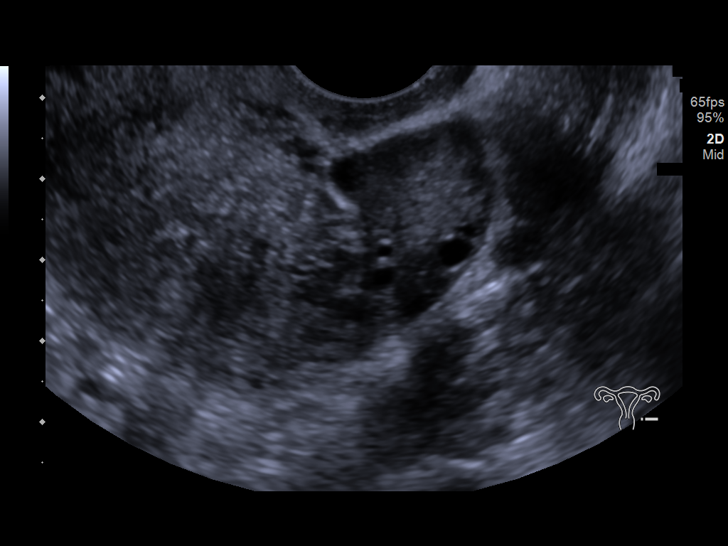
[im 71/78]
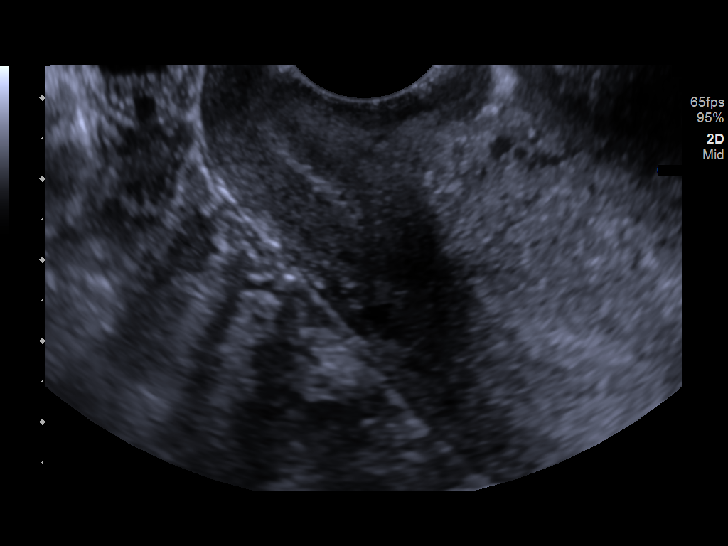
[im 78/78]
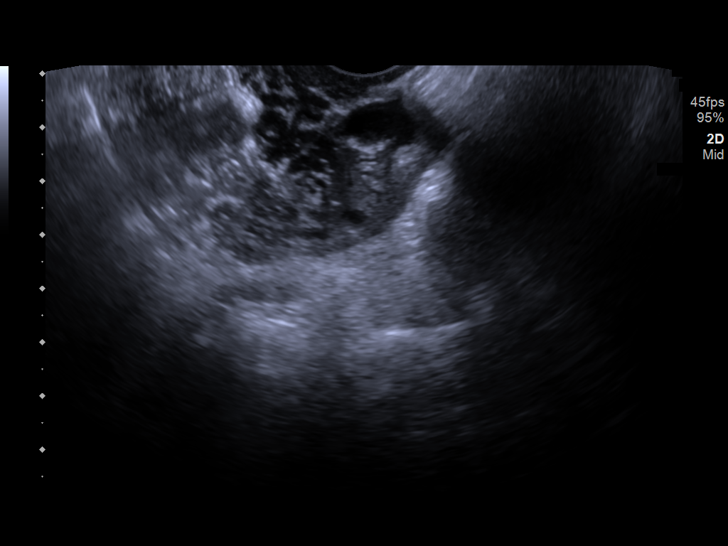

[13 of 25 positions shown; findings below may reference images not displayed]

FINDINGS: Uterus

Measurements: 7.7 x 4.4 x 5.4 cm = volume: 97.1 mL. No fibroids or
other mass visualized.

Endometrium

Thickness: 1.4 mm.  No focal abnormality visualized.

Right ovary

Measurements: 3.6 x 1.4 x 2.3 cm = volume: 6.1 mL. Normal
appearance/no adnexal mass.

Left ovary

Measurements: 3.6 x 2.2 x 1.8 cm = volume: 7.1 mL. Normal
appearance/no adnexal mass.

Pulsed Doppler evaluation of both ovaries demonstrates normal
low-resistance arterial and venous waveforms.

Other findings

No abnormal free fluid.
IMPRESSION: Negative pelvic ultrasound.  No evidence for torsion.

## 2021-05-29 IMAGING — CT CT RENAL STONE PROTOCOL
2 of 4 series · 17 of 46 positions shown, 19 images · non-contrast
Comparison: Pelvic ultrasound dated 02/08/2019

CLINICAL DATA: Lower back pain and pelvic pain.

EXAM:
CT ABDOMEN AND PELVIS WITHOUT CONTRAST
TECHNIQUE: Multidetector CT imaging of the abdomen and pelvis was performed
following the standard protocol without IV contrast.

[Series 3: ap without · axial · non-contrast · 0.82mm/px · z∈[-766,-330]mm · 14 of 99 slices shown, 16 images]
[im 6/99  soft-tissue]
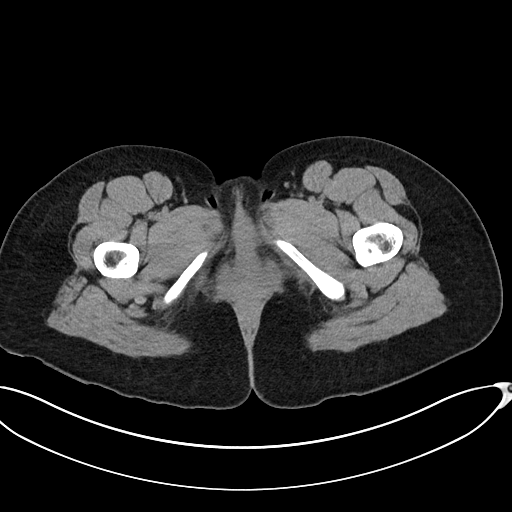
[im 6/99  bone]
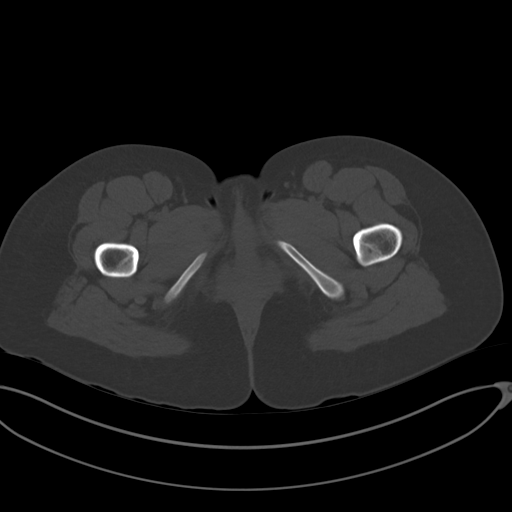
[im 11/99  soft-tissue]
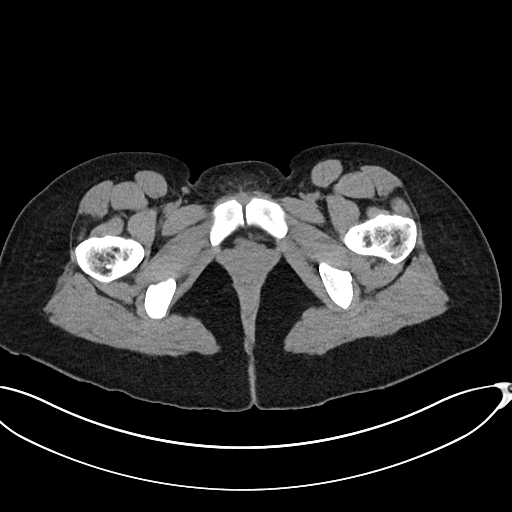
[im 21/99  soft-tissue]
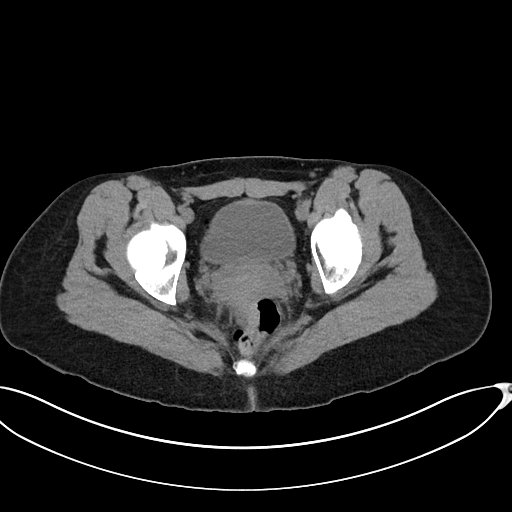
[im 26/99  soft-tissue]
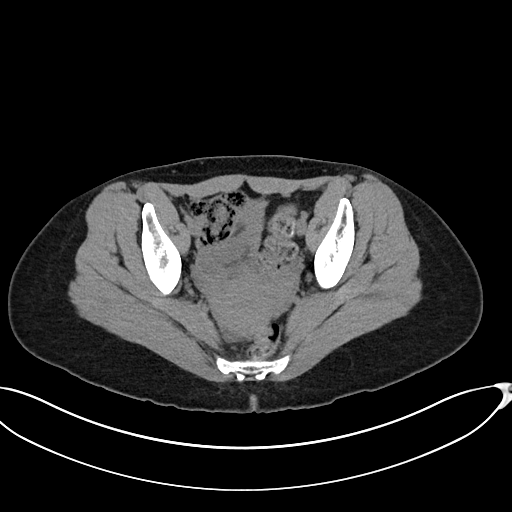
[im 31/99  soft-tissue]
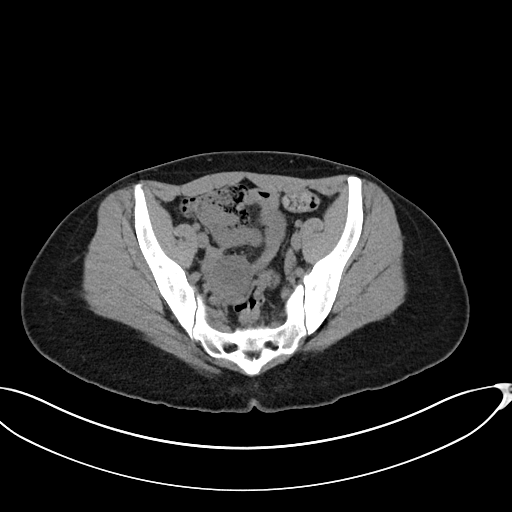
[im 42/99  soft-tissue]
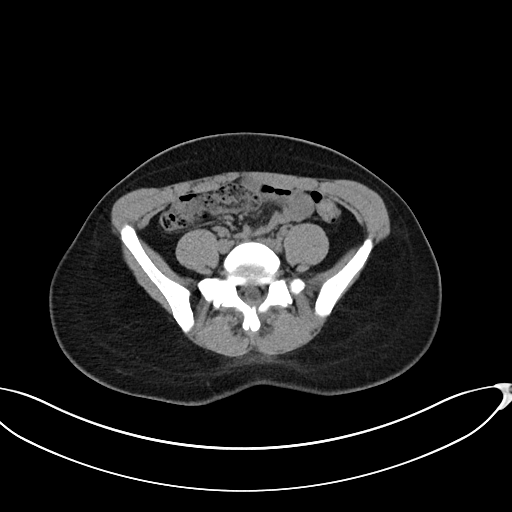
[im 47/99  soft-tissue]
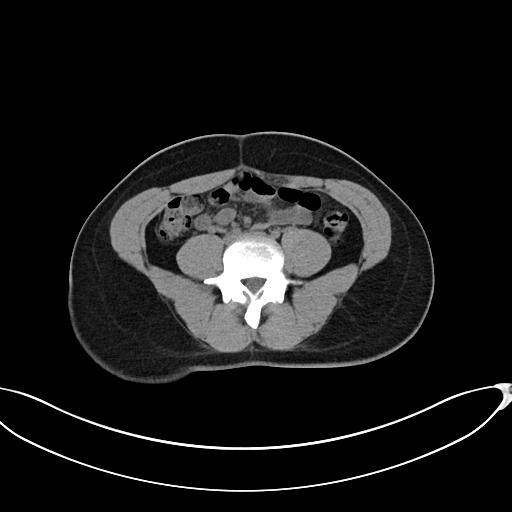
[im 52/99  soft-tissue]
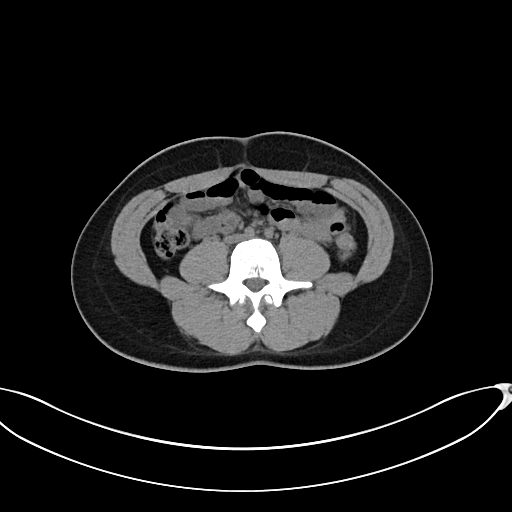
[im 57/99  soft-tissue]
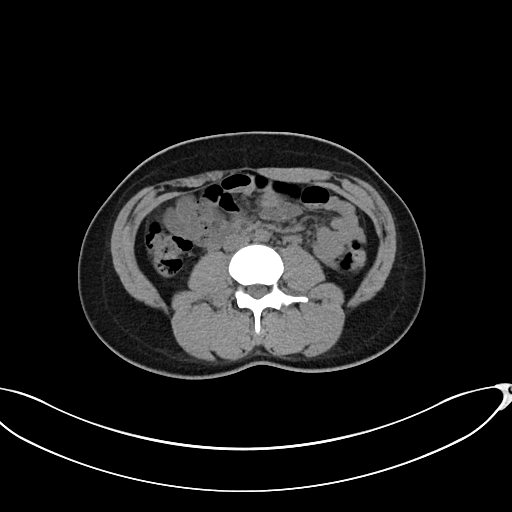
[im 57/99  bone]
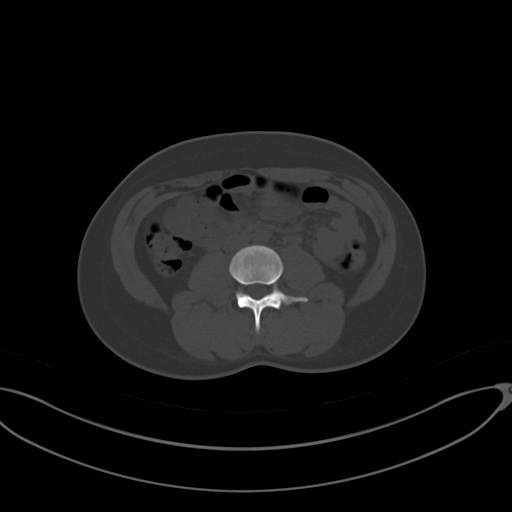
[im 68/99  soft-tissue]
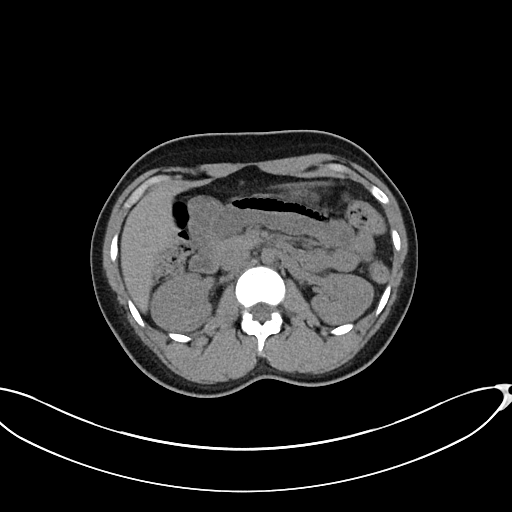
[im 73/99  soft-tissue]
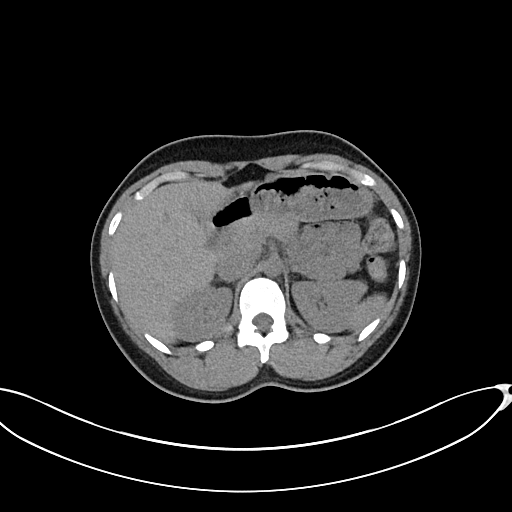
[im 78/99  soft-tissue]
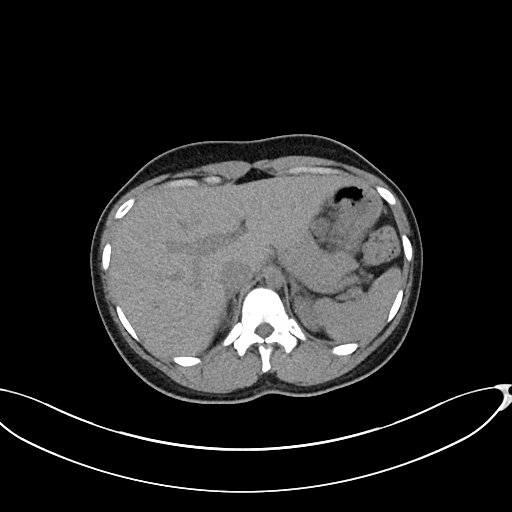
[im 88/99  soft-tissue]
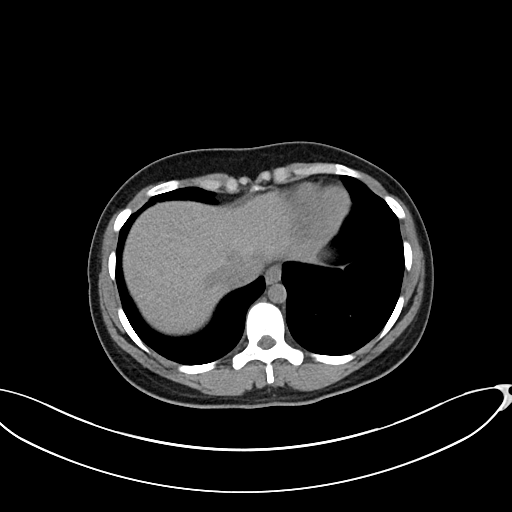
[im 93/99  soft-tissue]
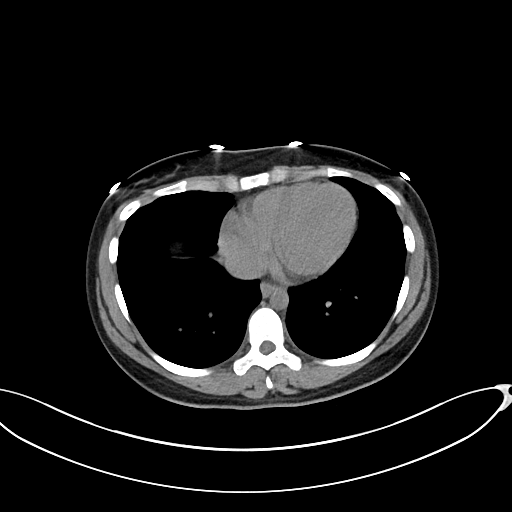

[Series 6: cor · coronal · 0.83mm/px · 3 of 83 slices shown]
[im 28/83  soft-tissue]
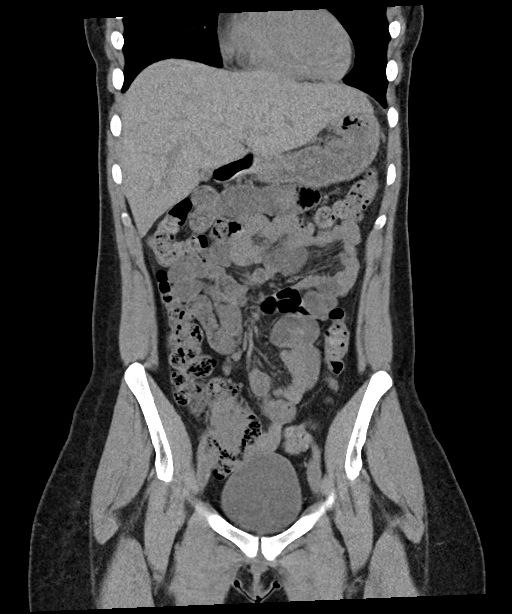
[im 37/83  soft-tissue]
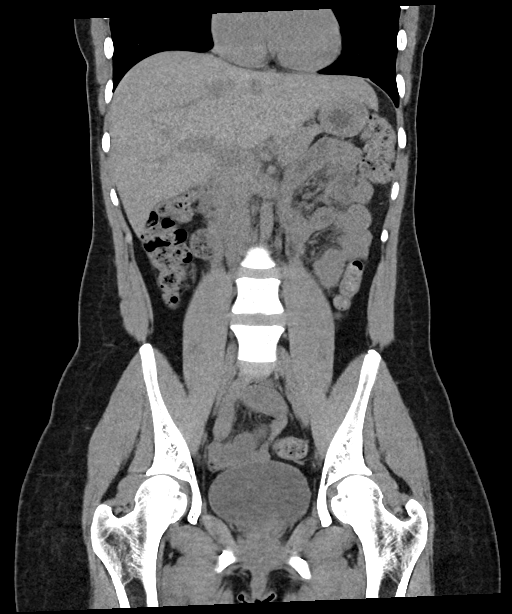
[im 46/83  soft-tissue]
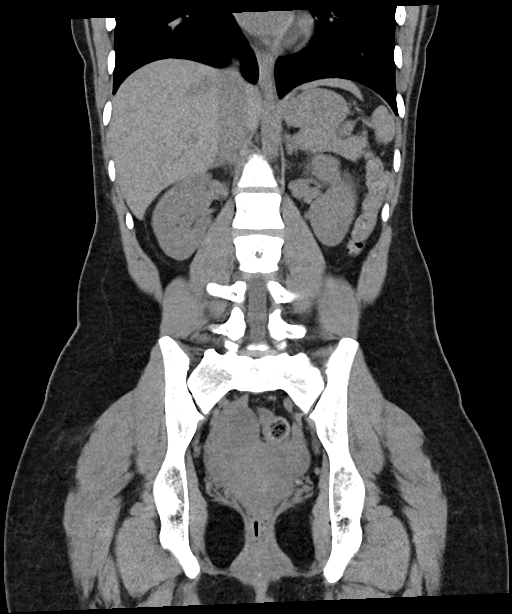

[17 of 46 positions shown; findings below may reference images not displayed]

FINDINGS: Lower chest: No acute abnormality.

Hepatobiliary: No focal liver abnormality is seen. No gallstones,
gallbladder wall thickening, or biliary dilatation.

Pancreas: Unremarkable. No pancreatic ductal dilatation or
surrounding inflammatory changes.

Spleen: Normal in size without focal abnormality.

Adrenals/Urinary Tract: Adrenal glands are unremarkable. Kidneys are
normal, without renal calculi, focal lesion, or hydronephrosis.
Bladder is unremarkable.

Stomach/Bowel: Stomach is within normal limits. No pericecal
inflammatory changes are noted to suggest acute appendicitis. No
evidence of bowel wall thickening, distention, or inflammatory
changes.

Vascular/Lymphatic: No significant vascular findings are present. No
enlarged abdominal or pelvic lymph nodes.

Reproductive: Uterus and bilateral adnexa are unremarkable.

Other: No abdominal wall hernia or abnormality. No abdominopelvic
ascites.

Musculoskeletal: No acute or significant osseous findings.
IMPRESSION: No acute process in the abdomen or pelvis.
